# Patient Record
Sex: Female | Born: 1940 | Race: White | Hispanic: No | Smoking: Never smoker
Health system: Southern US, Community
[De-identification: ages and names within clinical notes are randomized; demographics above are authoritative.]

## PROBLEM LIST (undated history)

## (undated) DIAGNOSIS — E785 Hyperlipidemia, unspecified: Secondary | ICD-10-CM

## (undated) DIAGNOSIS — R0600 Dyspnea, unspecified: Secondary | ICD-10-CM

## (undated) DIAGNOSIS — I480 Paroxysmal atrial fibrillation: Secondary | ICD-10-CM

## (undated) DIAGNOSIS — H409 Unspecified glaucoma: Secondary | ICD-10-CM

## (undated) DIAGNOSIS — H269 Unspecified cataract: Secondary | ICD-10-CM

## (undated) DIAGNOSIS — M810 Age-related osteoporosis without current pathological fracture: Secondary | ICD-10-CM

## (undated) DIAGNOSIS — C679 Malignant neoplasm of bladder, unspecified: Secondary | ICD-10-CM

## (undated) DIAGNOSIS — F32A Depression, unspecified: Secondary | ICD-10-CM

## (undated) DIAGNOSIS — R0609 Other forms of dyspnea: Secondary | ICD-10-CM

## (undated) HISTORY — DX: Malignant neoplasm of bladder, unspecified: C67.9

## (undated) HISTORY — DX: Other forms of dyspnea: R06.09

## (undated) HISTORY — PX: OTHER SURGICAL HISTORY: SHX169

## (undated) HISTORY — DX: Paroxysmal atrial fibrillation: I48.0

## (undated) HISTORY — DX: Age-related osteoporosis without current pathological fracture: M81.0

## (undated) HISTORY — DX: Dyspnea, unspecified: R06.00

## (undated) HISTORY — DX: Unspecified glaucoma: H40.9

## (undated) HISTORY — DX: Hyperlipidemia, unspecified: E78.5

## (undated) HISTORY — DX: Depression, unspecified: F32.A

## (undated) HISTORY — DX: Unspecified cataract: H26.9

---

## 2014-02-07 ENCOUNTER — Telehealth: Payer: Self-pay | Admitting: Cardiovascular Disease

## 2014-02-07 NOTE — Telephone Encounter (Signed)
Received records from [facility]--(Dr Gilford Rile) for appointment on 11/18 with Dr Gwenlyn Found.  Records given to [facility] (medical records) for Dr Kennon Holter schedule on 02/09/14.  lp

## 2014-02-09 ENCOUNTER — Ambulatory Visit (INDEPENDENT_AMBULATORY_CARE_PROVIDER_SITE_OTHER): Payer: Medicare Other | Admitting: Cardiovascular Disease

## 2014-02-09 ENCOUNTER — Encounter: Payer: Self-pay | Admitting: Cardiovascular Disease

## 2014-02-09 ENCOUNTER — Other Ambulatory Visit: Payer: Self-pay | Admitting: *Deleted

## 2014-02-09 VITALS — BP 116/80 | HR 119 | Ht 59.0 in | Wt 109.0 lb

## 2014-02-09 DIAGNOSIS — Z79899 Other long term (current) drug therapy: Secondary | ICD-10-CM

## 2014-02-09 DIAGNOSIS — E785 Hyperlipidemia, unspecified: Secondary | ICD-10-CM | POA: Insufficient documentation

## 2014-02-09 DIAGNOSIS — I480 Paroxysmal atrial fibrillation: Secondary | ICD-10-CM

## 2014-02-09 DIAGNOSIS — D689 Coagulation defect, unspecified: Secondary | ICD-10-CM

## 2014-02-09 DIAGNOSIS — I4891 Unspecified atrial fibrillation: Secondary | ICD-10-CM

## 2014-02-09 HISTORY — DX: Hyperlipidemia, unspecified: E78.5

## 2014-02-09 MED ORDER — METOPROLOL SUCCINATE ER 50 MG PO TB24: 50.0000 mg | ORAL_TABLET | Freq: Every day | ORAL | Status: DC

## 2014-02-09 NOTE — Patient Instructions (Signed)
Electrical Cardioversion Electrical cardioversion is the delivery of a jolt of electricity to change the rhythm of the heart. Sticky patches or metal paddles are placed on the chest to deliver the electricity from a device. This is done to restore a normal rhythm. A rhythm that is too fast or not regular keeps the heart from pumping well. Electrical cardioversion is done in an emergency if:   There is low or no blood pressure as a result of the heart rhythm.   Normal rhythm must be restored as fast as possible to protect the brain and heart from further damage.   It may save a life. Cardioversion may be done for heart rhythms that are not immediately life threatening, such as atrial fibrillation or flutter, in which:   The heart is beating too fast or is not regular.   Medicine to change the rhythm has not worked.   It is safe to wait in order to allow time for preparation.  Symptoms of the abnormal rhythm are bothersome.  The risk of stroke and other serious problems can be reduced. LET [facility] CARE PROVIDER KNOW ABOUT:   Any allergies you have.  All medicines you are taking, including vitamins, herbs, eye drops, creams, and over-the-counter medicines.  Previous problems you or members of your family have had with the use of anesthetics.   Any blood disorders you have.   Previous surgeries you have had.   Medical conditions you have. RISKS AND COMPLICATIONS  Generally, this is a safe procedure. However, problems can occur and include:   Breathing problems related to the anesthetic used.  A blood clot that breaks free and travels to other parts of your body. This could cause a stroke or other problems. The risk of this is lowered by use of blood-thinning medicine (anticoagulant) prior to the procedure.  Cardiac arrest (rare). BEFORE THE PROCEDURE   You may have tests to detect blood clots in your heart and to evaluate heart function.  You may start  taking anticoagulants so your blood does not clot as easily.   Medicines may be given to help stabilize your heart rate and rhythm. PROCEDURE  You will be given medicine through an IV tube to reduce discomfort and make you sleepy (sedative).   An electrical shock will be delivered. AFTER THE PROCEDURE Your heart rhythm will be watched to make sure it does not change.  Document Released: 03/01/2002 Document Revised: 07/26/2013 Document Reviewed: 09/23/2012 [facility] Patient Information 2015 [place], [place]. This information is not intended to replace advice given to you by your health care provider. Make sure you discuss any questions you have with your health care provider.   Dr Gwenlyn Found has ordered: 1. Increase Metoprolol succinate to 50mg  daily  2.  Echocardiogram. Echocardiography is a painless test that uses sound waves to create images of your heart. It provides your doctor with information about the size and shape of your heart and how well your heart's chambers and valves are working. This procedure takes approximately one hour. There are no restrictions for this procedure.   3. Your physician has recommended that you have a Cardioversion (DCCV) in one month. Electrical Cardioversion uses a jolt of electricity to your heart either through paddles or wired patches attached to your chest. This is a controlled, usually prescheduled, procedure. Defibrillation is done under light anesthesia in the hospital, and you usually go home the day of the procedure. This is done to get your heart  back into a normal rhythm. You are not awake for the procedure. Please see the instruction sheet given to you today.

## 2014-02-09 NOTE — Assessment & Plan Note (Signed)
On statin therapy followed by her PCP 

## 2014-02-09 NOTE — Progress Notes (Signed)
   02/09/2014 Tami Bell   07/10/1940  [id number]  Primary Physician Bell,GREG, MD Primary Cardiologist: Tami Bell J. Tami Moudy MD FACP,FACC,FAHA, FSCAI   HPI:  Tami Bell is a delightful 73-year-old mildly overweight married Caucasian female who states husband Tami Bell is also a patient of mine and who accompanies her today. She is the mother of 3, grandmother and 5 grandchildren. She was referred by Dr. Grisso in [place] for evaluation treatment of new onset symptomatic atrial fibrillation. Her only cardiovascular risk factor is treated hyperlipidemia. She does have an older brother who died of a myocardial infarction and had bypass surgery. She has never had a heart attack or stroke. She denies chest pain or shortness of breath. There is no history of bleeding problems. She noticed increased heart rate and fatigue 2-3 weeks ago and saw her primary care physician who documented new onset A. Fib with RVR. He began low-dose beta blocker and oral anticoagulation resulting in improvement in her symptoms. Routine lab work was performed and apparently her thyroid function tests were normal.   Current Outpatient Prescriptions  Medication Sig Dispense Refill  . bimatoprost (LUMIGAN) 0.03 % ophthalmic solution Place 1 drop into both eyes at bedtime.    . citalopram (CELEXA) 10 MG tablet Take 10 mg by mouth daily.    . dorzolamide (TRUSOPT) 2 % ophthalmic solution 1 drop 2 (two) times daily.    . metoprolol succinate (TOPROL-XL) 25 MG 24 hr tablet Take 25 mg by mouth daily.    . Polyethyl Glycol-Propyl Glycol (SYSTANE OP) Apply 1 drop to eye 2 (two) times daily.    . rivaroxaban (XARELTO) 20 MG TABS tablet Take 20 mg by mouth daily with supper.    . simvastatin (ZOCOR) 80 MG tablet Take 80 mg by mouth daily.    . timolol (TIMOPTIC) 0.25 % ophthalmic solution Place 1 drop into both eyes daily.     No current facility-administered medications for this visit.    No Known  Allergies  History   Social History  . Marital Status: Married    Spouse Name: N/A    Number of Children: N/A  . Years of Education: N/A   Occupational History  . Not on file.   Social History Main Topics  . Smoking status: Never Smoker   . Smokeless tobacco: Not on file  . Alcohol Use: Not on file  . Drug Use: Not on file  . Sexual Activity: Not on file   Other Topics Concern  . Not on file   Social History Narrative  . No narrative on file     Review of Systems: General: negative for chills, fever, night sweats or weight changes.  Cardiovascular: negative for chest pain, dyspnea on exertion, edema, orthopnea, palpitations, paroxysmal nocturnal dyspnea or shortness of breath Dermatological: negative for rash Respiratory: negative for cough or wheezing Urologic: negative for hematuria Abdominal: negative for nausea, vomiting, diarrhea, bright red blood per rectum, melena, or hematemesis Neurologic: negative for visual changes, syncope, or dizziness All other systems reviewed and are otherwise negative except as noted above.    Blood pressure 116/80, pulse 119, height 4' 11" (1.499 m), weight 109 lb (49.442 kg).  General appearance: alert and no distress Neck: no adenopathy, no carotid bruit, no JVD, supple, symmetrical, trachea midline and thyroid not enlarged, symmetric, no tenderness/mass/nodules Lungs: clear to auscultation bilaterally Heart: irregularly irregular rhythm Extremities: extremities normal, atraumatic, no cyanosis or edema and 2+ pedal pulses bilaterally  EKG atrial fibrillation    with rapid ventricular response . I personally reviewed this EKG  ASSESSMENT AND PLAN:   Paroxysmal atrial fibrillation The patient noticed increased heart rate of less than 2-3 weeks. She saw her primary care physician, Dr. Greg Bell , who began her on a low-dose beta blocker and Xarelto oral anticoagulation for atrial fibrillation with rapid ventricular response. She has  felt better since starting the beta blocker. She denies chest pain or shortness of breath but has noticed increased fatigue. I'm going to increase her beta blocker to 50 mg of metoprolol succinate daily, continue her oral anticoagulant and appeared 2-D echocardiogram. I will arrange for her to undergo outpatient DC cardioversion in approximately 4 weeks.  Hyperlipidemia On statin therapy followed by her PCP      Tami Bell J. Tami Rosko MD FACP,FACC,FAHA, FSCAI 02/09/2014 1:21 PM  

## 2014-02-09 NOTE — Assessment & Plan Note (Signed)
The patient noticed increased heart rate of less than 2-3 weeks. She saw her primary care physician, Dr. Gilford Rile , who began her on a low-dose beta blocker and Xarelto oral anticoagulation for atrial fibrillation with rapid ventricular response. She has felt better since starting the beta blocker. She denies chest pain or shortness of breath but has noticed increased fatigue. I'm going to increase her beta blocker to 50 mg of metoprolol succinate daily, continue her oral anticoagulant and appeared 2-D echocardiogram. I will arrange for her to undergo outpatient DC cardioversion in approximately 4 weeks.

## 2014-02-10 ENCOUNTER — Other Ambulatory Visit: Payer: Self-pay | Admitting: Cardiovascular Disease

## 2014-02-10 MED ORDER — RIVAROXABAN 20 MG PO TABS: 20.0000 mg | ORAL_TABLET | Freq: Every day | ORAL | Status: DC

## 2014-02-10 NOTE — Telephone Encounter (Signed)
Rx was sent to pharmacy electronically. Patient's husband notified.

## 2014-02-10 NOTE — Telephone Encounter (Signed)
Pt's husband called in stating that a prescription for Xarelto needs to be refilled and called in to [facility]. Please call  Thanks

## 2014-02-11 ENCOUNTER — Ambulatory Visit (HOSPITAL_COMMUNITY)
Admission: RE | Admit: 2014-02-11 | Discharge: 2014-02-11 | Disposition: A | Discharge status: Home / Self Care | Payer: Medicare Other | Source: Ambulatory Visit | Attending: Cardiology | Admitting: Cardiology

## 2014-02-11 DIAGNOSIS — I480 Paroxysmal atrial fibrillation: Secondary | ICD-10-CM | POA: Insufficient documentation

## 2014-02-11 DIAGNOSIS — E785 Hyperlipidemia, unspecified: Secondary | ICD-10-CM | POA: Insufficient documentation

## 2014-02-11 DIAGNOSIS — I059 Rheumatic mitral valve disease, unspecified: Secondary | ICD-10-CM

## 2014-02-11 DIAGNOSIS — Z8249 Family history of ischemic heart disease and other diseases of the circulatory system: Secondary | ICD-10-CM | POA: Diagnosis not present

## 2014-02-11 NOTE — Progress Notes (Signed)
2D Echocardiogram Complete.  02/11/2014   Tami Bell, [place]

## 2014-03-07 ENCOUNTER — Telehealth: Payer: Self-pay | Admitting: Cardiovascular Disease

## 2014-03-07 NOTE — Telephone Encounter (Signed)
Husband is calling in to see if pt's labs today are fasting labs.

## 2014-03-07 NOTE — Telephone Encounter (Signed)
Spoke with husband [name]. He needed to know if Tahisha's labs needed to be fasting. Informed husband that they did not need to be. Also clarified that cardioversion is scheduled for 2pm on 12/18

## 2014-03-08 LAB — CBC
HCT: 43.9 % (ref 36.0–46.0)
HEMOGLOBIN: 14.7 g/dL (ref 12.0–15.0)
MCH: 33.2 pg (ref 26.0–34.0)
MCHC: 33.5 g/dL (ref 30.0–36.0)
MCV: 99.1 fL (ref 78.0–100.0)
MPV: 11 fL (ref 9.4–12.4)
Platelets: 209 10*3/uL (ref 150–400)
RBC: 4.43 MIL/uL (ref 3.87–5.11)
RDW: 13.5 % (ref 11.5–15.5)
WBC: 4.7 10*3/uL (ref 4.0–10.5)

## 2014-03-08 LAB — APTT: aPTT: 33 seconds (ref 24–37)

## 2014-03-08 LAB — BASIC METABOLIC PANEL
BUN: 15 mg/dL (ref 6–23)
CHLORIDE: 105 meq/L (ref 96–112)
CO2: 31 meq/L (ref 19–32)
Calcium: 9.9 mg/dL (ref 8.4–10.5)
Creat: 0.79 mg/dL (ref 0.50–1.10)
Glucose, Bld: 67 mg/dL — ABNORMAL LOW (ref 70–99)
POTASSIUM: 4.4 meq/L (ref 3.5–5.3)
SODIUM: 144 meq/L (ref 135–145)

## 2014-03-08 LAB — PROTIME-INR
INR: 1.39 (ref <1.50)
Prothrombin Time: 17.1 seconds — ABNORMAL HIGH (ref 11.6–15.2)

## 2014-03-08 LAB — TSH: TSH: 1.647 u[IU]/mL (ref 0.350–4.500)

## 2014-03-10 ENCOUNTER — Encounter: Payer: Self-pay | Admitting: *Deleted

## 2014-03-11 ENCOUNTER — Other Ambulatory Visit: Payer: Self-pay | Admitting: Physician Assistant

## 2014-03-11 ENCOUNTER — Ambulatory Visit (HOSPITAL_COMMUNITY)
Admission: RE | Admit: 2014-03-11 | Discharge: 2014-03-11 | Disposition: A | Discharge status: Home / Self Care | Payer: Medicare Other | Source: Ambulatory Visit | Attending: Cardiovascular Disease | Admitting: Cardiovascular Disease

## 2014-03-11 ENCOUNTER — Ambulatory Visit (HOSPITAL_COMMUNITY): Payer: Medicare Other | Admitting: Anesthesiology

## 2014-03-11 ENCOUNTER — Telehealth: Payer: Self-pay | Admitting: Physician Assistant

## 2014-03-11 ENCOUNTER — Encounter (HOSPITAL_COMMUNITY): Payer: Self-pay | Admitting: *Deleted

## 2014-03-11 ENCOUNTER — Encounter (HOSPITAL_COMMUNITY)
Admission: RE | Disposition: A | Discharge status: Home / Self Care | Payer: Self-pay | Source: Ambulatory Visit | Attending: Cardiovascular Disease

## 2014-03-11 DIAGNOSIS — I480 Paroxysmal atrial fibrillation: Secondary | ICD-10-CM | POA: Insufficient documentation

## 2014-03-11 DIAGNOSIS — E785 Hyperlipidemia, unspecified: Secondary | ICD-10-CM | POA: Insufficient documentation

## 2014-03-11 DIAGNOSIS — Z8249 Family history of ischemic heart disease and other diseases of the circulatory system: Secondary | ICD-10-CM | POA: Insufficient documentation

## 2014-03-11 DIAGNOSIS — I4891 Unspecified atrial fibrillation: Secondary | ICD-10-CM

## 2014-03-11 DIAGNOSIS — I471 Supraventricular tachycardia: Secondary | ICD-10-CM | POA: Diagnosis not present

## 2014-03-11 HISTORY — PX: CARDIOVERSION: SHX1299

## 2014-03-11 SURGERY — CARDIOVERSION
Anesthesia: Monitor Anesthesia Care

## 2014-03-11 MED ORDER — LIDOCAINE HCL (CARDIAC) 20 MG/ML IV SOLN
INTRAVENOUS | Status: DC | PRN
  Administered 2014-03-11: 20 mg via INTRAVENOUS

## 2014-03-11 MED ORDER — RIVAROXABAN 20 MG PO TABS: 20.0000 mg | ORAL_TABLET | Freq: Every day | ORAL | Status: DC

## 2014-03-11 MED ORDER — METOPROLOL SUCCINATE ER 50 MG PO TB24: 100.0000 mg | ORAL_TABLET | Freq: Every day | ORAL | Status: DC

## 2014-03-11 MED ORDER — SODIUM CHLORIDE 0.9 % IV SOLN
INTRAVENOUS | Status: DC
  Administered 2014-03-11: 500 mL via INTRAVENOUS

## 2014-03-11 MED ORDER — PROPOFOL 10 MG/ML IV BOLUS
INTRAVENOUS | Status: DC | PRN
  Administered 2014-03-11: 50 mg via INTRAVENOUS

## 2014-03-11 NOTE — Interval H&P Note (Signed)
History and Physical Interval Note:  03/11/2014 8:39 AM  Tami Bell  has presented today for surgery, with the diagnosis of A FIB   The various methods of treatment have been discussed with the patient and family. After consideration of risks, benefits and other options for treatment, the patient has consented to  Procedure(s): CARDIOVERSION (N/A) as a surgical intervention .  The patient's history has been reviewed, patient examined, no change in status, stable for surgery.  I have reviewed the patient's chart and labs.  Questions were answered to the patient's satisfaction.     [name]

## 2014-03-11 NOTE — Anesthesia Postprocedure Evaluation (Signed)
  Anesthesia Post-op Note  Patient: Tami Bell  Procedure(s) Performed: Procedure(s): CARDIOVERSION (N/A)  Patient Location: PACU  Anesthesia Type:MAC  Level of Consciousness: awake and alert   Airway and Oxygen Therapy: Patient Spontanous Breathing and Patient connected to nasal cannula oxygen  Post-op Pain: none  Post-op Assessment: Post-op Vital signs reviewed  Post-op Vital Signs: Reviewed and stable  Last Vitals:  Filed Vitals:   03/11/14 1246  BP: 109/83  Resp: 14    Complications: No apparent anesthesia complications

## 2014-03-11 NOTE — H&P (View-Only) (Signed)
02/09/2014 Tami Bell   15-Jan-1941  [id number]  Primary Physician Gilford Rile, MD Primary Cardiologist: [name] [name]   HPI:  Tami Bell is a delightful 73 year old mildly overweight married Caucasian female who states husband Tami Bell is also a patient of mine and who accompanies her today. She is the mother of 46, grandmother and 5 grandchildren. She was referred by [name] in [facility] for evaluation treatment of new onset symptomatic atrial fibrillation. Her only cardiovascular risk factor is treated hyperlipidemia. She does have an older brother who died of a myocardial infarction and had bypass surgery. She has never had a heart attack or stroke. She denies chest pain or shortness of breath. There is no history of bleeding problems. She noticed increased heart rate and fatigue 2-3 weeks ago and saw her primary care physician who documented new onset A. Fib with RVR. He began low-dose beta blocker and oral anticoagulation resulting in improvement in her symptoms. Routine lab work was performed and apparently her thyroid function tests were normal.   Current Outpatient Prescriptions  Medication Sig Dispense Refill  . bimatoprost (LUMIGAN) 0.03 % ophthalmic solution Place 1 drop into both eyes at bedtime.    . citalopram (CELEXA) 10 MG tablet Take 10 mg by mouth daily.    . dorzolamide (TRUSOPT) 2 % ophthalmic solution 1 drop 2 (two) times daily.    . metoprolol succinate (TOPROL-XL) 25 MG 24 hr tablet Take 25 mg by mouth daily.    [name] (SYSTANE OP) Apply 1 drop to eye 2 (two) times daily.    . rivaroxaban (XARELTO) 20 MG TABS tablet Take 20 mg by mouth daily with supper.    . simvastatin (ZOCOR) 80 MG tablet Take 80 mg by mouth daily.    . timolol (TIMOPTIC) 0.25 % ophthalmic solution Place 1 drop into both eyes daily.     No current facility-administered medications for this visit.    No Known  Allergies  History   Social History  . Marital Status: Married    Spouse Name: N/A    Number of Children: N/A  . Years of Education: N/A   Occupational History  . Not on file.   Social History Main Topics  . Smoking status: Never Smoker   . Smokeless tobacco: Not on file  . Alcohol Use: Not on file  . Drug Use: Not on file  . Sexual Activity: Not on file   Other Topics Concern  . Not on file   Social History Narrative  . No narrative on file     Review of Systems: General: negative for chills, fever, night sweats or weight changes.  Cardiovascular: negative for chest pain, dyspnea on exertion, edema, orthopnea, palpitations, paroxysmal nocturnal dyspnea or shortness of breath Dermatological: negative for rash Respiratory: negative for cough or wheezing Urologic: negative for hematuria Abdominal: negative for nausea, vomiting, diarrhea, bright red blood per rectum, melena, or hematemesis Neurologic: negative for visual changes, syncope, or dizziness All other systems reviewed and are otherwise negative except as noted above.    Blood pressure 116/80, pulse 119, height 4\' 11"  (1.499 m), weight 109 lb (49.442 kg).  General appearance: alert and no distress Neck: no adenopathy, no carotid bruit, no JVD, supple, symmetrical, trachea midline and thyroid not enlarged, symmetric, no tenderness/mass/nodules Lungs: clear to auscultation bilaterally Heart: irregularly irregular rhythm Extremities: extremities normal, atraumatic, no cyanosis or edema and 2+ pedal pulses bilaterally  EKG atrial fibrillation  with rapid ventricular response . I personally reviewed this EKG  ASSESSMENT AND PLAN:   Paroxysmal atrial fibrillation The patient noticed increased heart rate of less than 2-3 weeks. She saw her primary care physician, Dr. Gilford Rile , who began her on a low-dose beta blocker and Xarelto oral anticoagulation for atrial fibrillation with rapid ventricular response. She has  felt better since starting the beta blocker. She denies chest pain or shortness of breath but has noticed increased fatigue. I'm going to increase her beta blocker to 50 mg of metoprolol succinate daily, continue her oral anticoagulant and appeared 2-D echocardiogram. I will arrange for her to undergo outpatient DC cardioversion in approximately 4 weeks.  Hyperlipidemia On statin therapy followed by her PCP      [name] [facility] 02/09/2014 1:21 PM

## 2014-03-11 NOTE — Transfer of Care (Signed)
Immediate Anesthesia Transfer of Care Note  Patient: Tami Bell  Procedure(s) Performed: Procedure(s): CARDIOVERSION (N/A)  Patient Location: Endoscopy Unit  Anesthesia Type:MAC  Level of Consciousness: awake  Airway & Oxygen Therapy: Patient Spontanous Breathing and Patient connected to nasal cannula oxygen  Post-op Assessment: Report given to PACU RN and Post -op Vital signs reviewed and stable  Post vital signs: Reviewed and stable  Complications: No apparent anesthesia complications

## 2014-03-11 NOTE — Discharge Instructions (Addendum)

## 2014-03-11 NOTE — Telephone Encounter (Signed)
     Patient paged after hours provider wanting clarification on what medications to keep taking. She had a DCCV today and wanted to know if she should keep taking the xarelto, she only had 2 left. I told her to definitely keep taking this medication and refilled it for her at her pharmacy. She voiced understanding.   Angelena Form PA-C  [facility]

## 2014-03-11 NOTE — Anesthesia Preprocedure Evaluation (Addendum)
Anesthesia Evaluation  Patient identified by MRN, date of birth, ID band Patient awake    Reviewed: Allergy & Precautions, H&P , NPO status , Patient's Chart, lab work & pertinent test results, reviewed documented beta blocker date and time   Airway Mallampati: II   Neck ROM: Full    Dental  (+) Teeth Intact, Edentulous Upper   Pulmonary neg pulmonary ROS,  breath sounds clear to auscultation        Cardiovascular + dysrhythmias Atrial Fibrillation Rhythm:Irregular  EF 55%   Neuro/Psych    GI/Hepatic negative GI ROS, Neg liver ROS,   Endo/Other  negative endocrine ROS  Renal/GU negative Renal ROS     Musculoskeletal   Abdominal (+)  Abdomen: soft.    Peds  Hematology   Anesthesia Other Findings   Reproductive/Obstetrics                            Anesthesia Physical Anesthesia Plan  ASA: III  Anesthesia Plan: MAC   Post-op Pain Management:    Induction:   Airway Management Planned: Nasal Cannula  Additional Equipment:   Intra-op Plan:   Post-operative Plan:   Informed Consent: I have reviewed the patients History and Physical, chart, labs and discussed the procedure including the risks, benefits and alternatives for the proposed anesthesia with the patient or authorized representative who has indicated his/her understanding and acceptance.   Dental advisory given  Plan Discussed with: CRNA, Anesthesiologist and Surgeon  Anesthesia Plan Comments:        Anesthesia Quick Evaluation

## 2014-03-11 NOTE — Interval H&P Note (Signed)
History and Physical Interval Note:  03/11/2014 1:14 PM  Tami Bell  has presented today for surgery, with the diagnosis of A FIB   The various methods of treatment have been discussed with the patient and family. After consideration of risks, benefits and other options for treatment, the patient has consented to  Procedure(s): CARDIOVERSION (N/A) as a surgical intervention .  The patient's history has been reviewed, patient examined, no change in status, stable for surgery.  I have reviewed the patient's chart and labs.  Questions were answered to the patient's satisfaction.     Hy Swiatek

## 2014-03-11 NOTE — Op Note (Signed)
After IV propofol 50 mg IV was administered for sedation for cardioversion, the rhythm converted to ectopic atrial tachycardia and subsequently NSR75 bpm with PACs No cardioversion shock was necessary. Increase beta blocker and DC once sedation resolves.  Sanda Klein, MD, [facility] [phone] office [phone] pager

## 2014-03-14 ENCOUNTER — Telehealth: Payer: Self-pay | Admitting: Cardiovascular Disease

## 2014-03-14 ENCOUNTER — Encounter (HOSPITAL_COMMUNITY): Payer: Self-pay | Admitting: Cardiovascular Disease

## 2014-03-14 MED ORDER — METOPROLOL SUCCINATE ER 50 MG PO TB24: 100.0000 mg | ORAL_TABLET | Freq: Every day | ORAL | Status: DC

## 2014-03-14 NOTE — Telephone Encounter (Signed)
Refill sent for 90 day supply. Pt voiced understanding.

## 2014-03-14 NOTE — Telephone Encounter (Signed)
Mr.Tami Bell is calling because [name] is taking Metoprolol (2pils twice a day ) and she has only 30 pills and wants to know will she be able to get more refills after she has completed the first 30 day prescription or can it be for 90 days . Please call   Thanks

## 2014-03-21 ENCOUNTER — Telehealth: Payer: Self-pay | Admitting: Cardiovascular Disease

## 2014-03-21 NOTE — Telephone Encounter (Signed)
I have talked to this pt. And she states she has been feeling weak and tired and thinks its the new medication she is on ;she also wants something for her nerves,pt. States she has an appt to see Dr. Gwenlyn Found in February . Pt instructed to make appt. With PCP for nerve pill . Pt. Agreed with plan

## 2014-03-21 NOTE — Telephone Encounter (Signed)
Patient is weak and nervous.  Wants to talk with someone about her medications.

## 2014-04-05 ENCOUNTER — Telehealth: Payer: Self-pay | Admitting: Cardiovascular Disease

## 2014-04-05 NOTE — Telephone Encounter (Signed)
Returned call to patient. She reports persistent fatigue and lack of energy since being on metoprolol. Her PCP decreased her metoprolol succinate dose from 100mg  to 50mg  about 2 weeks ago, after she was advised to consult PCP for her "nerves". She states she felt a little better after the dose was decreased initially and was not as "uptight" but now has no energy again. She states she used to could walk/run a mile and play basketball but now she gives out if she walks 1/4 mile or washes the dishes. Inquired about patient's VS - she does not have a home BP monitor and checks BP at pharmacy. She states her BP and HR were "normal" but I told her normal is different for different people. She thinks her BP is around 120/84 and HR in the 60s. She has an OV with Dr. Gwenlyn Found Feb 5 but would like advice about what to do r/t her medications/side effects.   Message routed to Dr. Gwenlyn Found & [place], RN for advice and follow up

## 2014-04-05 NOTE — Telephone Encounter (Signed)
Please call,pt have no energy.Since her Metoprolol been decreased,still no energy

## 2014-04-06 NOTE — Telephone Encounter (Signed)
Wait until OV to discuss

## 2014-04-07 NOTE — Telephone Encounter (Signed)
I spoke with patient.  She complains of weakness and fatigue that has been occuring x 1 month.  She attributes it to the lopressor.  Since the decrease in lopressor dose, she has seen very little improvement.  She also complains of some chest tightness that has been occuring x1 month.  The tightness resolves with rest.  She denies palpitations.  I offered an appt today with another MD, she denied and wants to see Dr Gwenlyn Found.  I made her appt with Dr Gwenlyn Found 1/15.

## 2014-04-08 ENCOUNTER — Ambulatory Visit (INDEPENDENT_AMBULATORY_CARE_PROVIDER_SITE_OTHER): Payer: Self-pay | Admitting: Cardiovascular Disease

## 2014-04-08 ENCOUNTER — Encounter: Payer: Self-pay | Admitting: Cardiovascular Disease

## 2014-04-08 VITALS — BP 92/76 | HR 64 | Ht 59.0 in | Wt 110.0 lb

## 2014-04-08 DIAGNOSIS — I480 Paroxysmal atrial fibrillation: Secondary | ICD-10-CM

## 2014-04-08 DIAGNOSIS — R079 Chest pain, unspecified: Secondary | ICD-10-CM

## 2014-04-08 DIAGNOSIS — E785 Hyperlipidemia, unspecified: Secondary | ICD-10-CM

## 2014-04-08 NOTE — Progress Notes (Signed)
04/08/2014 Sofie Hartigan Findling   12-20-1940  [id number]  Primary Physician Gilford Rile, MD Primary Cardiologist: [name] [name]   HPI:  Mrs. Payes is a delightful 74 year old mildly overweight married Caucasian female who states husband [name] is also a patient of mine and who accompanies her today. I last saw her 02/09/14. She is the mother of 26, grandmother and 5 grandchildren. She was referred by [name] in [facility] for evaluation treatment of new onset symptomatic atrial fibrillation. Her only cardiovascular risk factor is treated hyperlipidemia. She does have an older brother who died of a myocardial infarction and had bypass surgery. She has never had a heart attack or stroke. She denies chest pain or shortness of breath. There is no history of bleeding problems. She noticed increased heart rate and fatigue 2-3 weeks ago and saw her primary care physician who documented new onset A. Fib with RVR. He began low-dose beta blocker and oral anticoagulation resulting in improvement in her symptoms. Routine lab work was performed and apparently her thyroid function tests were normal.she was hospitalized in December with A. Fib with RVR and spontaneously converted to sinus rhythm. She feels weak and has had increasing dyspnea on exertion.   Current Outpatient Prescriptions  Medication Sig Dispense Refill  . bimatoprost (LUMIGAN) 0.03 % ophthalmic solution Place 1 drop into both eyes at bedtime.    . citalopram (CELEXA) 10 MG tablet Take 10 mg by mouth daily.    . dorzolamide (TRUSOPT) 2 % ophthalmic solution Place 1 drop into both eyes 2 (two) times daily.     . metoprolol succinate (TOPROL-XL) 50 MG 24 hr tablet Take 2 tablets (100 mg total) by mouth daily. 180 tablet 3  . Polyethyl Glycol-Propyl Glycol (SYSTANE OP) Apply 1 drop to eye 2 (two) times daily.    . rivaroxaban (XARELTO) 20 MG TABS tablet Take 1 tablet (20 mg total) by mouth daily with supper. 30  tablet 11  . simvastatin (ZOCOR) 80 MG tablet Take 80 mg by mouth daily.    . timolol (TIMOPTIC) 0.25 % ophthalmic solution Place 1 drop into both eyes daily.     No current facility-administered medications for this visit.    No Known Allergies  History   Social History  . Marital Status: Married    Spouse Name: N/A    Number of Children: N/A  . Years of Education: N/A   Occupational History  . Not on file.   Social History Main Topics  . Smoking status: Never Smoker   . Smokeless tobacco: Not on file  . Alcohol Use: No  . Drug Use: No  . Sexual Activity: Not on file   Other Topics Concern  . Not on file   Social History Narrative     Review of Systems: General: negative for chills, fever, night sweats or weight changes.  Cardiovascular: negative for chest pain, dyspnea on exertion, edema, orthopnea, palpitations, paroxysmal nocturnal dyspnea or shortness of breath Dermatological: negative for rash Respiratory: negative for cough or wheezing Urologic: negative for hematuria Abdominal: negative for nausea, vomiting, diarrhea, bright red blood per rectum, melena, or hematemesis Neurologic: negative for visual changes, syncope, or dizziness All other systems reviewed and are otherwise negative except as noted above.    Blood pressure 92/76, pulse 64, height 4\' 11"  (1.499 m), weight 110 lb (49.896 kg).  General appearance: alert and no distress Neck: no adenopathy, no carotid bruit, no JVD, supple, symmetrical, trachea midline and  thyroid not enlarged, symmetric, no tenderness/mass/nodules Lungs: clear to auscultation bilaterally Heart: regular rate and rhythm, S1, S2 normal, no murmur, click, rub or gallop Extremities: extremities normal, atraumatic, no cyanosis or edema  EKG normal sinus rhythm at 64 with occasional PACs. I personally reviewed this EKG  ASSESSMENT AND PLAN:   Paroxysmal atrial fibrillation History of PAF first noticed in November of last year  admitted to Hospital in December and Spontaneously Converting before She Could Be DC Cardioverted. She Is on Xarelto and a Beta Blocker. She Complains of Increasing Dyspnea on Exertion. I Am Going to Get a Pharmacologic Myoview Stress Test to Rule Out an Ischemic Etiology.   Hyperlipidemia History of hyperlipidemia on simvastatin 80 mg a day followed by her PCP       [name] [facility], [facility] 04/08/2014 1:06 PM

## 2014-04-08 NOTE — Assessment & Plan Note (Signed)
History of hyperlipidemia on simvastatin 80 mg a day followed by her PCP

## 2014-04-08 NOTE — Patient Instructions (Signed)
  We will see you back in follow up after test.  Dr Gwenlyn Found has ordered: Leane Call- this is a test that looks at the blood flow to your heart muscle.  It takes approximately 2 1/2 hours. Please follow instruction sheet, as given.

## 2014-04-08 NOTE — Assessment & Plan Note (Signed)
History of PAF first noticed in November of last year admitted to Hospital in December and Spontaneously Converting before She Could Be DC Cardioverted. She Is on Xarelto and a Beta Blocker. She Complains of Increasing Dyspnea on Exertion. I Am Going to Get a Pharmacologic Myoview Stress Test to Rule Out an Ischemic Etiology.

## 2014-04-12 ENCOUNTER — Telehealth (HOSPITAL_COMMUNITY): Payer: Self-pay

## 2014-04-12 NOTE — Telephone Encounter (Signed)
Encounter completed.

## 2014-04-14 ENCOUNTER — Ambulatory Visit (HOSPITAL_COMMUNITY)
Admission: RE | Admit: 2014-04-14 | Discharge: 2014-04-14 | Disposition: A | Discharge status: Home / Self Care | Payer: Medicare PPO | Source: Ambulatory Visit | Attending: Cardiology | Admitting: Cardiology

## 2014-04-14 DIAGNOSIS — R5383 Other fatigue: Secondary | ICD-10-CM | POA: Insufficient documentation

## 2014-04-14 DIAGNOSIS — R42 Dizziness and giddiness: Secondary | ICD-10-CM | POA: Insufficient documentation

## 2014-04-14 DIAGNOSIS — R9431 Abnormal electrocardiogram [ECG] [EKG]: Secondary | ICD-10-CM | POA: Diagnosis not present

## 2014-04-14 DIAGNOSIS — R002 Palpitations: Secondary | ICD-10-CM | POA: Diagnosis not present

## 2014-04-14 DIAGNOSIS — Z8249 Family history of ischemic heart disease and other diseases of the circulatory system: Secondary | ICD-10-CM | POA: Diagnosis not present

## 2014-04-14 DIAGNOSIS — R0609 Other forms of dyspnea: Secondary | ICD-10-CM | POA: Diagnosis not present

## 2014-04-14 DIAGNOSIS — R079 Chest pain, unspecified: Secondary | ICD-10-CM

## 2014-04-14 DIAGNOSIS — E785 Hyperlipidemia, unspecified: Secondary | ICD-10-CM | POA: Insufficient documentation

## 2014-04-14 MED ORDER — AMINOPHYLLINE 25 MG/ML IV SOLN
75.0000 mg | Freq: Once | INTRAVENOUS | Status: AC
  Administered 2014-04-14: 75 mg via INTRAVENOUS

## 2014-04-14 MED ORDER — REGADENOSON 0.4 MG/5ML IV SOLN
0.4000 mg | Freq: Once | INTRAVENOUS | Status: AC
  Administered 2014-04-14: 0.4 mg via INTRAVENOUS

## 2014-04-14 MED ORDER — TECHNETIUM TC 99M SESTAMIBI GENERIC - CARDIOLITE
10.5000 | Freq: Once | INTRAVENOUS | Status: AC | PRN
  Administered 2014-04-14: 11 via INTRAVENOUS

## 2014-04-14 MED ORDER — TECHNETIUM TC 99M SESTAMIBI GENERIC - CARDIOLITE
31.6000 | Freq: Once | INTRAVENOUS | Status: AC | PRN
  Administered 2014-04-14: 32 via INTRAVENOUS

## 2014-04-14 NOTE — Procedures (Addendum)
[facility] [address] [place] [address] [phone]  Cardiology Nuclear Med Study  Tami Bell is a 74 y.o. female     MRN : [id number]     DOB: [birth year]  Procedure Date: 04/14/2014  Nuclear Med Background Indication for Stress Test:  Evaluation for Ischemia and Abnormal EKG History:  AFIB;No prior respiratpry history reported;No prior NUC MPI for comparison; Cardiac Risk Factors: Family History - CAD and Lipids  Symptoms:  Dizziness, DOE, Fatigue, Light-Headedness and Palpitations   Nuclear Pre-Procedure Caffeine/Decaff Intake:  1:00am NPO After: 11am   IV Site: R Forearm  IV 0.9% NS with Angio Cath:  22g  Chest Size (in):  n/a IV Started by: Rolene Course, RN  Height: 4\' 11"  (1.499 m)  Cup Size: A  BMI:  Body mass index is 22.21 kg/(m^2). Weight:  110 lb (49.896 kg)   Tech Comments:  n/a    Nuclear Med Study 1 or 2 day study: 1 day  Stress Test Type:  [place] Provider:  Quay Burow, MD   Resting Radionuclide: Technetium 15m Sestamibi  Resting Radionuclide Dose: 10.5 mCi   Stress Radionuclide:  Technetium 42m Sestamibi  Stress Radionuclide Dose: 31.6 mCi           Stress Protocol Rest HR: 103 Stress HR: 122  Rest BP:135/98 Stress BP: 136/99  Exercise Time (min): n/a METS: n/a          Dose of Adenosine (mg):  n/a Dose of Lexiscan: 0.4 mg  Dose of Atropine (mg): n/a Dose of Dobutamine: n/a mcg/kg/min (at max HR)  Stress Test Technologist: Mellody Memos, CCT Nuclear Technologist: Imagene Riches, CNMT   Rest Procedure:  Myocardial perfusion imaging was performed at rest 45 minutes following the intravenous administration of Technetium 69m Sestamibi. Stress Procedure:  The patient received IV Lexiscan 0.4 mg over 15-seconds.  Technetium 69m Sestamibi injected IV at 30-seconds.  Patient experienced shortness of breath, stomach pains and was administered 75 mg of Aminophylline IV.  There were no significant changes with Lexiscan.  Quantitative spect images were obtained after a 45 minute delay.  Transient Ischemic Dilatation (Normal <1.22):  1.09  QGS EDV:  46 ml QGS ESV:  27 ml LV Ejection Fraction: 41%    Rest ECG: NSR with non-specific ST-T wave changes and Sinus with runs of ectopic atrial tachycardia vs slow atrial flutter.  Stress ECG: No significant ST segment change suggestive of ischemia.  QPS Raw Data Images:  Acquisition technically good; normal left ventricular size. Stress Images:  Normal homogeneous uptake in all areas of the myocardium. Rest Images:  Normal homogeneous uptake in all areas of the myocardium. Subtraction (SDS):  No evidence of ischemia.  Impression Exercise Capacity:  Lexiscan with no exercise. BP Response:  Normal blood pressure response. Clinical Symptoms:  There is dyspnea. ECG Impression:  No significant ST segment change suggestive of ischemia. Comparison with Prior Nuclear Study: No previous nuclear study performed  Overall Impression:  Intermediate risk stress nuclear study with no ischemia or infarction; study classified as intermediate risk due to reduced LV function. Note patient was having runs of ectopic atrial tachycardia vs slow atrial flutter during the study.  LV Wall Motion:  Global hypokinesis; suggest echocardiogram to further assess LV function.   Kirk Ruths, MD  04/14/2014 4:28 PM

## 2014-04-19 ENCOUNTER — Telehealth: Payer: Self-pay | Admitting: *Deleted

## 2014-04-19 DIAGNOSIS — I519 Heart disease, unspecified: Secondary | ICD-10-CM

## 2014-04-19 NOTE — Telephone Encounter (Signed)
Order placed for echo.

## 2014-04-19 NOTE — Telephone Encounter (Signed)
-----   Message from Lorretta Harp, MD sent at 04/17/2014 11:32 PM EST ----- Intermediate risk myoview. Needs 2D for LV fxn then ROV

## 2014-04-21 ENCOUNTER — Ambulatory Visit (INDEPENDENT_AMBULATORY_CARE_PROVIDER_SITE_OTHER): Payer: Medicare PPO

## 2014-04-21 ENCOUNTER — Ambulatory Visit (HOSPITAL_COMMUNITY)
Admission: RE | Admit: 2014-04-21 | Discharge: 2014-04-21 | Disposition: A | Discharge status: Home / Self Care | Payer: Medicare PPO | Source: Ambulatory Visit | Attending: Cardiovascular Disease | Admitting: Cardiovascular Disease

## 2014-04-21 VITALS — BP 120/82 | HR 88 | Ht 59.0 in | Wt 111.0 lb

## 2014-04-21 DIAGNOSIS — I480 Paroxysmal atrial fibrillation: Secondary | ICD-10-CM | POA: Insufficient documentation

## 2014-04-21 DIAGNOSIS — I519 Heart disease, unspecified: Secondary | ICD-10-CM

## 2014-04-21 DIAGNOSIS — Z8249 Family history of ischemic heart disease and other diseases of the circulatory system: Secondary | ICD-10-CM | POA: Insufficient documentation

## 2014-04-21 DIAGNOSIS — E785 Hyperlipidemia, unspecified: Secondary | ICD-10-CM | POA: Diagnosis not present

## 2014-04-21 NOTE — Progress Notes (Addendum)
2D Echocardiogram Complete.  04/21/2014   Tami Bell, RDCS   Preliminary Technician Findings:  This was a technically difficult study due to the abnormal EKG.  [name] is having shortness of breath and fatigue due to the Atrial Fibrillation witnessed during this exam.  She also states she has had some dizziness and weakness.  This was reported to Dr. Sallyanne Kuster prior to the patient leaving, She has an appointment scheduled with Dr. Gwenlyn Found next Friday the 5th, which the patient was informed of.

## 2014-04-21 NOTE — Patient Instructions (Signed)
Continue same medications  Keep appointment with Dr. Gwenlyn Found 04/29/14 at 10:15 am.   Appointment scheduled with Roderic Palau NP [place] office Friday 05/06/14 at 1:00 pm   ( [phone] )  [address] 3rd floor

## 2014-04-21 NOTE — Progress Notes (Signed)
1.) Reason for visit: Atrial Fib  2.) Name of MD requesting visit: Dr.Croitoru   3.) H&P: Patient had a echo this morning.Patient wanted to be seen feels bad,no energy,light headed,sob.  4.) ROS related to problem: EKG was done revealed sinus rhythm with paroxysmal atrial tachycardia rate 88 bpm.  5.) Assessment and plan per MD: Dr.Croitoru DOD advised continue same medications.Schedule appointment [facility] Fib [facility].Appointment scheduled with Roderic Palau NP [facility] 05/06/14 at 1:00 pm.Advised keep previous appt with Dr.Berry 04/29/14 at 10:15 am.

## 2014-04-29 ENCOUNTER — Encounter: Payer: Self-pay | Admitting: Cardiovascular Disease

## 2014-04-29 ENCOUNTER — Ambulatory Visit (INDEPENDENT_AMBULATORY_CARE_PROVIDER_SITE_OTHER): Payer: Medicare PPO | Admitting: Cardiovascular Disease

## 2014-04-29 ENCOUNTER — Ambulatory Visit
Admission: RE | Admit: 2014-04-29 | Discharge: 2014-04-29 | Disposition: A | Discharge status: Home / Self Care | Payer: Commercial Managed Care - HMO | Source: Ambulatory Visit | Attending: Cardiovascular Disease | Admitting: Cardiovascular Disease

## 2014-04-29 VITALS — BP 118/72 | HR 59 | Ht 59.0 in | Wt 112.4 lb

## 2014-04-29 DIAGNOSIS — R0600 Dyspnea, unspecified: Secondary | ICD-10-CM

## 2014-04-29 DIAGNOSIS — Z01818 Encounter for other preprocedural examination: Secondary | ICD-10-CM

## 2014-04-29 DIAGNOSIS — I480 Paroxysmal atrial fibrillation: Secondary | ICD-10-CM

## 2014-04-29 DIAGNOSIS — D689 Coagulation defect, unspecified: Secondary | ICD-10-CM

## 2014-04-29 DIAGNOSIS — E785 Hyperlipidemia, unspecified: Secondary | ICD-10-CM

## 2014-04-29 DIAGNOSIS — R0609 Other forms of dyspnea: Secondary | ICD-10-CM

## 2014-04-29 LAB — CBC
HCT: 41.9 % (ref 36.0–46.0)
Hemoglobin: 14.5 g/dL (ref 12.0–15.0)
MCH: 33.6 pg (ref 26.0–34.0)
MCHC: 34.6 g/dL (ref 30.0–36.0)
MCV: 97.2 fL (ref 78.0–100.0)
MPV: 10.5 fL (ref 8.6–12.4)
Platelets: 199 10*3/uL (ref 150–400)
RBC: 4.31 MIL/uL (ref 3.87–5.11)
RDW: 13 % (ref 11.5–15.5)
WBC: 4.9 10*3/uL (ref 4.0–10.5)

## 2014-04-29 LAB — PROTIME-INR
INR: 1.26 (ref <1.50)
Prothrombin Time: 15.8 seconds — ABNORMAL HIGH (ref 11.6–15.2)

## 2014-04-29 LAB — BASIC METABOLIC PANEL
BUN: 14 mg/dL (ref 6–23)
CO2: 28 mEq/L (ref 19–32)
CREATININE: 0.76 mg/dL (ref 0.50–1.10)
Calcium: 9.6 mg/dL (ref 8.4–10.5)
Chloride: 104 mEq/L (ref 96–112)
Glucose, Bld: 92 mg/dL (ref 70–99)
POTASSIUM: 4.4 meq/L (ref 3.5–5.3)
SODIUM: 140 meq/L (ref 135–145)

## 2014-04-29 LAB — APTT: aPTT: 38 seconds — ABNORMAL HIGH (ref 24–37)

## 2014-04-29 NOTE — Assessment & Plan Note (Signed)
History of hyperlipidemia on simvastatin 80 mg a day. This is followed by her PCP

## 2014-04-29 NOTE — Progress Notes (Signed)
04/29/2014 Tami Bell   1941-02-14  [id number]  Primary Physician Tami Rile, MD Primary Cardiologist: [name] [name]   HPI:  Tami Bell is a delightful 74 year old mildly overweight married Caucasian female who states husband Tami Bell is also a patient of mine and who accompanies her today. I last saw her 04/08/14. She is the mother of 17, grandmother and 5 grandchildren. She was referred by Dr. Bea Bell in [facility] for evaluation treatment of new onset symptomatic atrial fibrillation. Her only cardiovascular risk factor is treated hyperlipidemia. She does have an older brother who died of a myocardial infarction and had bypass surgery. She has never had a heart attack or stroke. She denies chest pain but has had fairly recent increase in dyspnea on exertion. There is no history of bleeding problems. She noticed increased heart rate and fatigue 2-3 weeks prior to seeing me late last yearand saw her primary care physician who documented new onset A. Fib with RVR. He began low-dose beta blocker and oral anticoagulation resulting in improvement in her symptoms. Routine lab work was performed and apparently her thyroid function tests were normal.she was hospitalized in December with A. Fib with RVR and spontaneously converted to sinus rhythm. She feels weak and has had increasing dyspnea on exertion. Recent Myoview stress test showed no ischemia but had decreased ejection fraction and 2-D echo revealed an EF of 45-50% with mild global hypokinesia.   Current Outpatient Prescriptions  Medication Sig Dispense Refill  . bimatoprost (LUMIGAN) 0.03 % ophthalmic solution Place 1 drop into both eyes at bedtime.    . citalopram (CELEXA) 10 MG tablet Take 10 mg by mouth daily.    . dorzolamide (TRUSOPT) 2 % ophthalmic solution Place 1 drop into both eyes 2 (two) times daily.     . metoprolol succinate (TOPROL-XL) 50 MG 24 hr tablet Take 2 tablets (100 mg total) by mouth  daily. (Patient taking differently: Take 50 mg by mouth daily. ) 180 tablet 3  . Polyethyl Glycol-Propyl Glycol (SYSTANE OP) Apply 1 drop to eye 2 (two) times daily.    . rivaroxaban (XARELTO) 20 MG TABS tablet Take 1 tablet (20 mg total) by mouth daily with supper. 30 tablet 11  . simvastatin (ZOCOR) 80 MG tablet Take 80 mg by mouth daily.    . timolol (TIMOPTIC) 0.25 % ophthalmic solution Place 1 drop into both eyes daily.     No current facility-administered medications for this visit.    No Known Allergies  History   Social History  . Marital Status: Married    Spouse Name: N/A    Number of Children: N/A  . Years of Education: N/A   Occupational History  . Not on file.   Social History Main Topics  . Smoking status: Never Smoker   . Smokeless tobacco: Not on file  . Alcohol Use: No  . Drug Use: No  . Sexual Activity: Not on file   Other Topics Concern  . Not on file   Social History Narrative     Review of Systems: General: negative for chills, fever, night sweats or weight changes.  Cardiovascular: negative for chest pain, dyspnea on exertion, edema, orthopnea, palpitations, paroxysmal nocturnal dyspnea or shortness of breath Dermatological: negative for rash Respiratory: negative for cough or wheezing Urologic: negative for hematuria Abdominal: negative for nausea, vomiting, diarrhea, bright red blood per rectum, melena, or hematemesis Neurologic: negative for visual changes, syncope, or dizziness All other systems reviewed and  are otherwise negative except as noted above.    Blood pressure 118/72, pulse 59, height 4\' 11"  (1.499 m), weight 112 lb 6.4 oz (50.984 kg).  General appearance: alert and no distress Neck: no adenopathy, no carotid bruit, no JVD, supple, symmetrical, trachea midline and thyroid not enlarged, symmetric, no tenderness/mass/nodules Lungs: clear to auscultation bilaterally Heart: regular rate and rhythm, S1, S2 normal, no murmur, click,  rub or gallop Extremities: extremities normal, atraumatic, no cyanosis or edema  EKG sinus bradycardia 59 without ST or T-wave changes. I personally reviewed this EKG  ASSESSMENT AND PLAN:   Paroxysmal atrial fibrillation History of paroxysmal atrial fibrillation status post conversion back to normal sinus rhythm in December. She had a Myoview stress test that was nonischemic although her ejection fraction was diminished. A 2-D echocardiogram performed recently revealed mild global hypokinesia with EF of 45-50%. She is maintaining sinus rhythm with atrial ectopy on metoprolol and Xarelto  oral anticoagulation   Hyperlipidemia History of hyperlipidemia on simvastatin 80 mg a day. This is followed by her PCP   Dyspnea on exertion She has fairly recent onset dyspnea on exertion for unclear reasons. Her Myoview stress test was nonischemic and she does have mild LV dysfunction and recent PAF maintaining sinus rhythm. I am going to perform outpatient diagnostic coronary arteriography. The right radial approach to define her anatomy and rule out ischemic etiology.       [name] FACP,FACC,FAHA, [facility] 04/29/2014 11:36 AM

## 2014-04-29 NOTE — Assessment & Plan Note (Signed)
She has fairly recent onset dyspnea on exertion for unclear reasons. Her Myoview stress test was nonischemic and she does have mild LV dysfunction and recent PAF maintaining sinus rhythm. I am going to perform outpatient diagnostic coronary arteriography. The right radial approach to define her anatomy and rule out ischemic etiology.

## 2014-04-29 NOTE — Patient Instructions (Signed)
Your physician has requested that you have a cardiac catheterization (Right radial; **Patient on Xarelto-needs to STOP 2 days prior to procedure**). Cardiac catheterization is used to diagnose and/or treat various heart conditions. Doctors may recommend this procedure for a number of different reasons. The most common reason is to evaluate chest pain. Chest pain can be a symptom of coronary artery disease (CAD), and cardiac catheterization can show whether plaque is narrowing or blocking your heart's arteries. This procedure is also used to evaluate the valves, as well as measure the blood flow and oxygen levels in different parts of your heart. For further information please visit [email or web address].   Following your catheterization, you will not be allowed to drive for 3 days.  No lifting, pushing, or pulling greater that 10 pounds is allowed for 1 week.  You will be required to have the following tests prior to the procedure:  1. Blood work-the blood work can be done no more than 7 days prior to the procedure.  It can be done at any [facility] lab.  There is one downstairs on the first floor of this building and one in the [facility] ([address])  2. Chest Xray-the chest xray order has already been placed at the [facility].

## 2014-04-29 NOTE — Assessment & Plan Note (Signed)
History of paroxysmal atrial fibrillation status post conversion back to normal sinus rhythm in December. She had a Myoview stress test that was nonischemic although her ejection fraction was diminished. A 2-D echocardiogram performed recently revealed mild global hypokinesia with EF of 45-50%. She is maintaining sinus rhythm with atrial ectopy on metoprolol and Xarelto  oral anticoagulation

## 2014-05-05 ENCOUNTER — Encounter (HOSPITAL_COMMUNITY)
Admission: RE | Disposition: A | Discharge status: Home / Self Care | Payer: Self-pay | Source: Ambulatory Visit | Attending: Cardiovascular Disease

## 2014-05-05 ENCOUNTER — Ambulatory Visit (HOSPITAL_COMMUNITY)
Admission: RE | Admit: 2014-05-05 | Discharge: 2014-05-05 | Disposition: A | Discharge status: Home / Self Care | Payer: Medicare PPO | Source: Ambulatory Visit | Attending: Cardiovascular Disease | Admitting: Cardiovascular Disease

## 2014-05-05 ENCOUNTER — Encounter (HOSPITAL_COMMUNITY): Payer: Self-pay | Admitting: Cardiovascular Disease

## 2014-05-05 DIAGNOSIS — R0600 Dyspnea, unspecified: Secondary | ICD-10-CM

## 2014-05-05 DIAGNOSIS — I4891 Unspecified atrial fibrillation: Secondary | ICD-10-CM | POA: Insufficient documentation

## 2014-05-05 DIAGNOSIS — E785 Hyperlipidemia, unspecified: Secondary | ICD-10-CM

## 2014-05-05 DIAGNOSIS — D689 Coagulation defect, unspecified: Secondary | ICD-10-CM

## 2014-05-05 DIAGNOSIS — Z01818 Encounter for other preprocedural examination: Secondary | ICD-10-CM

## 2014-05-05 DIAGNOSIS — I480 Paroxysmal atrial fibrillation: Secondary | ICD-10-CM

## 2014-05-05 DIAGNOSIS — R0609 Other forms of dyspnea: Secondary | ICD-10-CM

## 2014-05-05 HISTORY — PX: LEFT HEART CATHETERIZATION WITH CORONARY ANGIOGRAM: SHX5451

## 2014-05-05 SURGERY — LEFT HEART CATHETERIZATION WITH CORONARY ANGIOGRAM
Anesthesia: LOCAL

## 2014-05-05 MED ORDER — ONDANSETRON HCL 4 MG/2ML IJ SOLN: 4.0000 mg | Freq: Four times a day (QID) | INTRAMUSCULAR | Status: DC | PRN

## 2014-05-05 MED ORDER — NITROGLYCERIN 1 MG/10 ML FOR IR/CATH LAB
INTRA_ARTERIAL | Status: AC
  Filled 2014-05-05: qty 10

## 2014-05-05 MED ORDER — HEPARIN SODIUM (PORCINE) 1000 UNIT/ML IJ SOLN
INTRAMUSCULAR | Status: AC
  Filled 2014-05-05: qty 1

## 2014-05-05 MED ORDER — ASPIRIN 81 MG PO CHEW
CHEWABLE_TABLET | ORAL | Status: AC
  Administered 2014-05-05: 81 mg via ORAL
  Filled 2014-05-05: qty 1

## 2014-05-05 MED ORDER — MIDAZOLAM HCL 2 MG/2ML IJ SOLN
INTRAMUSCULAR | Status: AC
  Filled 2014-05-05: qty 2

## 2014-05-05 MED ORDER — LIDOCAINE HCL (PF) 1 % IJ SOLN
INTRAMUSCULAR | Status: AC
  Filled 2014-05-05: qty 30

## 2014-05-05 MED ORDER — VERAPAMIL HCL 2.5 MG/ML IV SOLN
INTRAVENOUS | Status: AC
  Filled 2014-05-05: qty 2

## 2014-05-05 MED ORDER — FENTANYL CITRATE 0.05 MG/ML IJ SOLN
INTRAMUSCULAR | Status: AC
  Filled 2014-05-05: qty 2

## 2014-05-05 MED ORDER — HEPARIN (PORCINE) IN NACL 2-0.9 UNIT/ML-% IJ SOLN
INTRAMUSCULAR | Status: AC
  Filled 2014-05-05: qty 1500

## 2014-05-05 MED ORDER — ACETAMINOPHEN 325 MG PO TABS: 650.0000 mg | ORAL_TABLET | ORAL | Status: DC | PRN

## 2014-05-05 MED ORDER — SODIUM CHLORIDE 0.9 % IJ SOLN: 3.0000 mL | INTRAMUSCULAR | Status: DC | PRN

## 2014-05-05 MED ORDER — MORPHINE SULFATE 2 MG/ML IJ SOLN: 1.0000 mg | INTRAMUSCULAR | Status: DC | PRN

## 2014-05-05 MED ORDER — SODIUM CHLORIDE 0.9 % IV SOLN
INTRAVENOUS | Status: DC
  Administered 2014-05-05: 08:00:00 via INTRAVENOUS

## 2014-05-05 MED ORDER — SODIUM CHLORIDE 0.9 % IV SOLN
INTRAVENOUS | Status: AC
  Administered 2014-05-05: 12:00:00 via INTRAVENOUS

## 2014-05-05 MED ORDER — ASPIRIN 81 MG PO CHEW
81.0000 mg | CHEWABLE_TABLET | ORAL | Status: AC
  Administered 2014-05-05: 81 mg via ORAL

## 2014-05-05 NOTE — Discharge Instructions (Signed)
Radial Site Care °Refer to this sheet in the next few weeks. These instructions provide you with information on caring for yourself after your procedure. Your caregiver may also give you more specific instructions. Your treatment has been planned according to current medical practices, but problems sometimes occur. Call your caregiver if you have any problems or questions after your procedure. °HOME CARE INSTRUCTIONS °· You may shower the day after the procedure. Remove the bandage (dressing) and gently wash the site with plain soap and water. Gently pat the site dry. °· Do not apply powder or lotion to the site. °· Do not submerge the affected site in water for 3 to 5 days. °· Inspect the site at least twice daily. °· Do not flex or bend the affected arm for 24 hours. °· No lifting over 5 pounds (2.3 kg) for 5 days after your procedure. °· Do not drive home if you are discharged the same day of the procedure. Have someone else drive you. °· You may drive 24 hours after the procedure unless otherwise instructed by your caregiver. °· Do not operate machinery or power tools for 24 hours. °· A responsible adult should be with you for the first 24 hours after you arrive home. °What to expect: °· Any bruising will usually fade within 1 to 2 weeks. °· Blood that collects in the tissue (hematoma) may be painful to the touch. It should usually decrease in size and tenderness within 1 to 2 weeks. °SEEK IMMEDIATE MEDICAL CARE IF: °· You have unusual pain at the radial site. °· You have redness, warmth, swelling, or pain at the radial site. °· You have drainage (other than a small amount of blood on the dressing). °· You have chills. °· You have a fever or persistent symptoms for more than 72 hours. °· You have a fever and your symptoms suddenly get worse. °· Your arm becomes pale, cool, tingly, or numb. °· You have heavy bleeding from the site. Hold pressure on the site. °Document Released: 04/13/2010 Document Revised:  06/03/2011 Document Reviewed: 04/13/2010 °ExitCare® Patient Information ©2015 [employer]. This information is not intended to replace advice given to you by your health care provider. Make sure you discuss any questions you have with your health care provider. ° °

## 2014-05-05 NOTE — CV Procedure (Signed)
Tami Bell is a 74 y.o. female    [id number] LOCATION:  FACILITY: [facility]  PHYSICIAN: Quay Burow, M.D. [birth year]   DATE OF PROCEDURE:  05/05/2014  DATE OF DISCHARGE:     CARDIAC CATHETERIZATION     History obtained from chart review.Tami Bell is a delightful 74 year old mildly overweight married Caucasian female who states husband Tami Bell is also a patient of mine and who accompanies her today. I last saw her 04/08/14. She is the mother of 76, grandmother and 5 grandchildren. She was referred by [name] in [facility] for evaluation treatment of new onset symptomatic atrial fibrillation. Her only cardiovascular risk factor is treated hyperlipidemia. She does have an older brother who died of a myocardial infarction and had bypass surgery. She has never had a heart attack or stroke. She denies chest pain but has had fairly recent increase in dyspnea on exertion. There is no history of bleeding problems. She noticed increased heart rate and fatigue 2-3 weeks prior to seeing me late last yearand saw her primary care physician who documented new onset A. Fib with RVR. He began low-dose beta blocker and oral anticoagulation resulting in improvement in her symptoms. Routine lab work was performed and apparently her thyroid function tests were normal.she was hospitalized in December with A. Fib with RVR and spontaneously converted to sinus rhythm. She feels weak and has had increasing dyspnea on exertion. Recent Myoview stress test showed no ischemia but had decreased ejection fraction and 2-D echo revealed an EF of 45-50% with mild global hypokinesia. She presents now for outpatient diagnostic coronary arteriography to rule out an ischemic etiology.   PROCEDURE DESCRIPTION:   The patient was brought to the second floor [facility] [facility]iac cath lab in the postabsorptive state. She was premedicated with Valium 5 mg by mouth, IV Versed and fentanyl. Her right wrist was prepped and shaved  in usual sterile fashion. Xylocaine 1% was used for local anesthesia. A 6 French sheath was inserted into the right radial artery using standard Seldinger technique. The patient received 2500 units  of heparin  intravenously.  A 5 French TIG catheter, FL 3-1/2 cm diagnostic catheter and pigtail catheters were used for selective coronary angiography, left ventriculography respectively. Visipaque dye was used for the entirety of the case (60 mL administered to patient). Retrograde aortic, left ventricular and pressures were recorded.    HEMODYNAMICS:    AO SYSTOLIC/AO DIASTOLIC: 272/53   LV SYSTOLIC/LV DIASTOLIC: 664/40  ANGIOGRAPHIC RESULTS:   1. Left main; normal  2. LAD; normal 3. Left circumflex; nondominant and normal.  4. Right coronary artery; dominant and normal 5. Left ventriculography; RAO left ventriculogram was performed using  25 mL of Visipaque dye at 12 mL/second. The overall LVEF estimated  50 %  Without wall motion abnormalities and with very subtle mild global hypokinesia  IMPRESSION:Tami Bell has normal coronary arteries and essentially normal LV function with normal filling pressures. I do not think that her dyspnea is related to anything cardiovascular. It may be primarily pulmonary which will need to be further evaluated. The sheath was removed and a TR band was placed on the right wrist to achieve patent hemostasis. The patient left the lab in stable condition. She'll be discharged home later today as an outpatient and will see me back in the office along with mid-level provider in several weeks.  [name], [facility] 05/05/2014 10:57 AM

## 2014-05-05 NOTE — H&P (View-Only) (Signed)
04/29/2014 Tami Bell   [birth year]  [id number]  Primary Physician Tami Rile, MD Primary Cardiologist: Tami Harp MD Tami Bell   HPI:  Tami Bell is a delightful 74 year old mildly overweight married Caucasian female who states husband Tami Bell is also a patient of mine and who accompanies her today. I last saw her 04/08/14. She is the mother of 70, grandmother and 5 grandchildren. She was referred by [name] in [facility] for evaluation treatment of new onset symptomatic atrial fibrillation. Her only cardiovascular risk factor is treated hyperlipidemia. She does have an older brother who died of a myocardial infarction and had bypass surgery. She has never had a heart attack or stroke. She denies chest pain but has had fairly recent increase in dyspnea on exertion. There is no history of bleeding problems. She noticed increased heart rate and fatigue 2-3 weeks prior to seeing me late last yearand saw her primary care physician who documented new onset A. Fib with RVR. He began low-dose beta blocker and oral anticoagulation resulting in improvement in her symptoms. Routine lab work was performed and apparently her thyroid function tests were normal.she was hospitalized in December with A. Fib with RVR and spontaneously converted to sinus rhythm. She feels weak and has had increasing dyspnea on exertion. Recent Myoview stress test showed no ischemia but had decreased ejection fraction and 2-D echo revealed an EF of 45-50% with mild global hypokinesia.   Current Outpatient Prescriptions  Medication Sig Dispense Refill  . bimatoprost (LUMIGAN) 0.03 % ophthalmic solution Place 1 drop into both eyes at bedtime.    . citalopram (CELEXA) 10 MG tablet Take 10 mg by mouth daily.    . dorzolamide (TRUSOPT) 2 % ophthalmic solution Place 1 drop into both eyes 2 (two) times daily.     . metoprolol succinate (TOPROL-XL) 50 MG 24 hr tablet Take 2 tablets (100 mg total) by mouth  daily. (Patient taking differently: Take 50 mg by mouth daily. ) 180 tablet 3  . Polyethyl Glycol-Propyl Glycol (SYSTANE OP) Apply 1 drop to eye 2 (two) times daily.    . rivaroxaban (XARELTO) 20 MG TABS tablet Take 1 tablet (20 mg total) by mouth daily with supper. 30 tablet 11  . simvastatin (ZOCOR) 80 MG tablet Take 80 mg by mouth daily.    . timolol (TIMOPTIC) 0.25 % ophthalmic solution Place 1 drop into both eyes daily.     No current facility-administered medications for this visit.    No Known Allergies  History   Social History  . Marital Status: Married    Spouse Name: N/A    Number of Children: N/A  . Years of Education: N/A   Occupational History  . Not on file.   Social History Main Topics  . Smoking status: Never Smoker   . Smokeless tobacco: Not on file  . Alcohol Use: No  . Drug Use: No  . Sexual Activity: Not on file   Other Topics Concern  . Not on file   Social History Narrative     Review of Systems: General: negative for chills, fever, night sweats or weight changes.  Cardiovascular: negative for chest pain, dyspnea on exertion, edema, orthopnea, palpitations, paroxysmal nocturnal dyspnea or shortness of breath Dermatological: negative for rash Respiratory: negative for cough or wheezing Urologic: negative for hematuria Abdominal: negative for nausea, vomiting, diarrhea, bright red blood per rectum, melena, or hematemesis Neurologic: negative for visual changes, syncope, or dizziness All other systems reviewed and  are otherwise negative except as noted above.    Blood pressure 118/72, pulse 59, height 4\' 11"  (1.499 m), weight 112 lb 6.4 oz (50.984 kg).  General appearance: alert and no distress Neck: no adenopathy, no carotid bruit, no JVD, supple, symmetrical, trachea midline and thyroid not enlarged, symmetric, no tenderness/mass/nodules Lungs: clear to auscultation bilaterally Heart: regular rate and rhythm, S1, S2 normal, no murmur, click,  rub or gallop Extremities: extremities normal, atraumatic, no cyanosis or edema  EKG sinus bradycardia 59 without ST or T-wave changes. I personally reviewed this EKG  ASSESSMENT AND PLAN:   Paroxysmal atrial fibrillation History of paroxysmal atrial fibrillation status post conversion back to normal sinus rhythm in December. She had a Myoview stress test that was nonischemic although her ejection fraction was diminished. A 2-D echocardiogram performed recently revealed mild global hypokinesia with EF of 45-50%. She is maintaining sinus rhythm with atrial ectopy on metoprolol and Xarelto  oral anticoagulation   Hyperlipidemia History of hyperlipidemia on simvastatin 80 mg a day. This is followed by her PCP   Dyspnea on exertion She has fairly recent onset dyspnea on exertion for unclear reasons. Her Myoview stress test was nonischemic and she does have mild LV dysfunction and recent PAF maintaining sinus rhythm. I am going to perform outpatient diagnostic coronary arteriography. The right radial approach to define her anatomy and rule out ischemic etiology.       Tami Harp MD FACP,FACC,FAHA, [facility] 04/29/2014 11:36 AM

## 2014-05-05 NOTE — Interval H&P Note (Signed)
Cath Lab Visit (complete for each Cath Lab visit)  Clinical Evaluation Leading to the Procedure:   ACS: No.  Non-ACS:    Anginal Classification: CCS III  Anti-ischemic medical therapy: No Therapy  Non-Invasive Test Results: Low-risk stress test findings: cardiac mortality <1%/year  Prior CABG: No previous CABG      History and Physical Interval Note:  05/05/2014 10:24 AM  Tami Bell  has presented today for surgery, with the diagnosis of sob  The various methods of treatment have been discussed with the patient and family. After consideration of risks, benefits and other options for treatment, the patient has consented to  Procedure(s): LEFT HEART CATHETERIZATION WITH CORONARY ANGIOGRAM (N/A) as a surgical intervention .  The patient's history has been reviewed, patient examined, no change in status, stable for surgery.  I have reviewed the patient's chart and labs.  Questions were answered to the patient's satisfaction.     Lorretta Harp

## 2014-05-06 ENCOUNTER — Ambulatory Visit (HOSPITAL_COMMUNITY): Payer: Medicare PPO | Admitting: Nurse Practitioner

## 2014-05-23 ENCOUNTER — Encounter: Payer: Self-pay | Admitting: Cardiovascular Disease

## 2014-05-23 ENCOUNTER — Ambulatory Visit (INDEPENDENT_AMBULATORY_CARE_PROVIDER_SITE_OTHER): Payer: Medicare PPO | Admitting: Cardiovascular Disease

## 2014-05-23 VITALS — BP 130/68 | HR 60 | Ht 59.0 in | Wt 111.9 lb

## 2014-05-23 DIAGNOSIS — R0609 Other forms of dyspnea: Secondary | ICD-10-CM

## 2014-05-23 DIAGNOSIS — I480 Paroxysmal atrial fibrillation: Secondary | ICD-10-CM

## 2014-05-23 DIAGNOSIS — E785 Hyperlipidemia, unspecified: Secondary | ICD-10-CM

## 2014-05-23 NOTE — Progress Notes (Signed)
05/23/2014 Tami Bell   07-31-1940  [id number]  Primary Physician Gilford Rile, MD Primary Cardiologist: [name] [name]   HPI:  Tami Bell is a delightful 74 year old mildly overweight married Caucasian female who states husband Tami Bell is also a patient of mine and who accompanies her today. I last saw her 04/29/14.  She is the mother of 17, grandmother and 5 grandchildren. She was referred by [name] in [facility] for evaluation treatment of new onset symptomatic atrial fibrillation. Her only cardiovascular risk factor is treated hyperlipidemia. She does have an older brother who died of a myocardial infarction and had bypass surgery. She has never had a heart attack or stroke. She denies chest pain but has had fairly recent increase in dyspnea on exertion. There is no history of bleeding problems. She noticed increased heart rate and fatigue 2-3 weeks prior to seeing me late last yearand saw her primary care physician who documented new onset A. Fib with RVR. He began low-dose beta blocker and oral anticoagulation resulting in improvement in her symptoms. Routine lab work was performed and apparently her thyroid function tests were normal.she was hospitalized in December with A. Fib with RVR and spontaneously converted to sinus rhythm. She was complaining of weakness and increasing dyspnea on exertion. Recent Myoview stress test showed no ischemia but had decreased ejection fraction and 2-D echo revealed an EF of 45-50% with mild global hypokinesia. I performed outpatient cardiac catheterization on her 05/05/14 via the right radial approach revealing normal coronary arteries and low normal LV function. Since that time her dyspnea has markedly improved.   Current Outpatient Prescriptions  Medication Sig Dispense Refill  . ALPRAZolam (XANAX) 0.25 MG tablet Take 0.5 tablets by mouth as needed.    . bimatoprost (LUMIGAN) 0.03 % ophthalmic solution Place 1 drop  into both eyes at bedtime.    . Calcium Carbonate-Vitamin D (CALCIUM + D PO) Take 1 tablet by mouth daily.    . citalopram (CELEXA) 10 MG tablet Take 10 mg by mouth daily.    . dorzolamide (TRUSOPT) 2 % ophthalmic solution Place 1 drop into both eyes 2 (two) times daily.     . metoprolol succinate (TOPROL-XL) 50 MG 24 hr tablet Take 2 tablets (100 mg total) by mouth daily. (Patient taking differently: Take 50 mg by mouth daily. ) 180 tablet 3  . Multiple Vitamins-Minerals (MULTIVITAMIN WITH MINERALS) tablet Take 1 tablet by mouth daily.    [name] (SYSTANE OP) Apply 1 drop to eye 2 (two) times daily.    . rivaroxaban (XARELTO) 20 MG TABS tablet Take 1 tablet (20 mg total) by mouth daily with supper. 30 tablet 11  . simvastatin (ZOCOR) 80 MG tablet Take 80 mg by mouth daily.    . timolol (TIMOPTIC) 0.25 % ophthalmic solution Place 1 drop into both eyes daily.     No current facility-administered medications for this visit.    No Known Allergies  History   Social History  . Marital Status: Married    Spouse Name: N/A  . Number of Children: N/A  . Years of Education: N/A   Occupational History  . Not on file.   Social History Main Topics  . Smoking status: Never Smoker   . Smokeless tobacco: Not on file  . Alcohol Use: No  . Drug Use: No  . Sexual Activity: Not on file   Other Topics Concern  . Not on file   Social  History Narrative     Review of Systems: General: negative for chills, fever, night sweats or weight changes.  Cardiovascular: negative for chest pain, dyspnea on exertion, edema, orthopnea, palpitations, paroxysmal nocturnal dyspnea or shortness of breath Dermatological: negative for rash Respiratory: negative for cough or wheezing Urologic: negative for hematuria Abdominal: negative for nausea, vomiting, diarrhea, bright red blood per rectum, melena, or hematemesis Neurologic: negative for visual changes, syncope, or dizziness All  other systems reviewed and are otherwise negative except as noted above.    Blood pressure 130/68, pulse 60, height 4\' 11"  (1.499 m), weight 111 lb 14.4 oz (50.758 kg).  General appearance: alert and no distress Neck: no adenopathy, no carotid bruit, no JVD, supple, symmetrical, trachea midline and thyroid not enlarged, symmetric, no tenderness/mass/nodules Lungs: clear to auscultation bilaterally Heart: regular rate and rhythm, S1, S2 normal, no murmur, click, rub or gallop Extremities: extremities normal, atraumatic, no cyanosis or edema and right radial artery puncture site is well-healed  EKG not performed today  ASSESSMENT AND PLAN:   Hyperlipidemia History of hyperlipidemia on simvastatin 80 mg a day followed by her PCP.   Dyspnea on exertion History of dyspnea on exertion with recent outpatient cardiac cath revealing normal coronary arteries and low normal LV function with EF of 50%. I do not think her dyspnea was cardiovascular nature. It has improved since I last saw her   Paroxysmal atrial fibrillation History of paroxysmal A. Fib currently on Xarelto oral anticoagulation as well as a beta blocker maintaining sinus rhythm       [name] [facility], [facility] 05/23/2014 3:09 PM

## 2014-05-23 NOTE — Assessment & Plan Note (Signed)
History of hyperlipidemia on simvastatin 80 mg a day followed by her PCP.

## 2014-05-23 NOTE — Assessment & Plan Note (Signed)
History of dyspnea on exertion with recent outpatient cardiac cath revealing normal coronary arteries and low normal LV function with EF of 50%. I do not think her dyspnea was cardiovascular nature. It has improved since I last saw her

## 2014-05-23 NOTE — Assessment & Plan Note (Signed)
History of paroxysmal A. Fib currently on Xarelto oral anticoagulation as well as a beta blocker maintaining sinus rhythm

## 2014-05-23 NOTE — Patient Instructions (Signed)
Dr Gwenlyn Found recommends that you schedule a follow-up appointment in 6 months with an extender.  Dr Gwenlyn Found wants you to follow-up in 1 year. You will receive a reminder letter in the mail two months in advance. If you don't receive a letter, please call our office to schedule the follow-up appointment.

## 2014-12-15 ENCOUNTER — Encounter: Payer: Self-pay | Admitting: Cardiology

## 2014-12-15 ENCOUNTER — Ambulatory Visit (INDEPENDENT_AMBULATORY_CARE_PROVIDER_SITE_OTHER): Payer: Medicare HMO | Admitting: Cardiology

## 2014-12-15 VITALS — BP 124/66 | HR 56 | Ht 59.0 in | Wt 107.1 lb

## 2014-12-15 DIAGNOSIS — E785 Hyperlipidemia, unspecified: Secondary | ICD-10-CM

## 2014-12-15 DIAGNOSIS — I480 Paroxysmal atrial fibrillation: Secondary | ICD-10-CM

## 2014-12-15 DIAGNOSIS — Z7901 Long term (current) use of anticoagulants: Secondary | ICD-10-CM | POA: Diagnosis not present

## 2014-12-15 MED ORDER — METOPROLOL SUCCINATE ER 50 MG PO TB24: 50.0000 mg | ORAL_TABLET | Freq: Every day | ORAL | Status: DC

## 2014-12-15 MED ORDER — RIVAROXABAN 20 MG PO TABS: 20.0000 mg | ORAL_TABLET | Freq: Every day | ORAL | Status: DC

## 2014-12-15 NOTE — Progress Notes (Signed)
Cardiology Office Note   Date:  12/15/2014   ID:  Jennifier, Smitherman [birth year], MRN [id number]  PCP:  Gilford Rile, MD  Cardiologist:  Dr. Gwenlyn Found    Chief Complaint  Patient presents with  . Follow-up    no complaints      History of Present Illness: VONNE MCDANEL is a 74 y.o. female who presents for PAF.  She has a hx of PAF and hyperlipidemia.  + FH of CAD with brother.  Myoview stress test showed no ischemia but had decreased ejection fraction and 2-D echo revealed an EF of 45-50% with mild global hypokinesia. Dr. Adora Fridge  performed outpatient cardiac catheterization on her 05/05/14 via the right radial approach revealing normal coronary arteries and low normal LV function.   Today no complaints.  She has no awareness of any a fib but not sure she knew she was in before.  No chest pain, no SOB.  Pt wants to come off xarelto   Past Medical History  Diagnosis Date  . Paroxysmal atrial fibrillation   . Hyperlipidemia   . Dyspnea on exertion     Past Surgical History  Procedure Laterality Date  . Cardioversion N/A 03/11/2014    Procedure: CARDIOVERSION;  Surgeon: Sanda Klein, MD;  Location: [facility] ENDOSCOPY;  Service: Cardiovascular;  Laterality: N/A;  . Left heart catheterization with coronary angiogram N/A 05/05/2014    Procedure: LEFT HEART CATHETERIZATION WITH CORONARY ANGIOGRAM;  Surgeon: Lorretta Harp, MD;  Location: [facility] CATH LAB;  Service: Cardiovascular;  Laterality: N/A;     Current Outpatient Prescriptions  Medication Sig Dispense Refill  . ALPRAZolam (XANAX) 0.25 MG tablet Take 0.5 tablets by mouth as needed.    . bimatoprost (LUMIGAN) 0.03 % ophthalmic solution Place 1 drop into both eyes at bedtime.    . Calcium Carbonate-Vitamin D (CALCIUM + D PO) Take 1 tablet by mouth daily.    . citalopram (CELEXA) 10 MG tablet Take 10 mg by mouth daily.    . dorzolamide (TRUSOPT) 2 % ophthalmic solution Place 1 drop into both eyes 2 (two) times daily.     . metoprolol  succinate (TOPROL-XL) 50 MG 24 hr tablet Take 2 tablets (100 mg total) by mouth daily. (Patient taking differently: Take 50 mg by mouth daily. ) 180 tablet 3  . Multiple Vitamins-Minerals (MULTIVITAMIN WITH MINERALS) tablet Take 1 tablet by mouth daily.    [name] (SYSTANE OP) Apply 1 drop to eye 2 (two) times daily.    . rivaroxaban (XARELTO) 20 MG TABS tablet Take 1 tablet (20 mg total) by mouth daily with supper. 30 tablet 11  . simvastatin (ZOCOR) 80 MG tablet Take 80 mg by mouth daily.    . timolol (TIMOPTIC) 0.25 % ophthalmic solution Place 1 drop into both eyes daily.     No current facility-administered medications for this visit.    Allergies:   Review of patient's allergies indicates no known allergies.    Social History:  The patient  reports that she has never smoked. She does not have any smokeless tobacco history on file. She reports that she does not drink alcohol or use illicit drugs.   Family History:  The patient's family hx with brother with CABG and died with MI.    ROS:  General:no colds or fevers, no weight changes Skin:no rashes or ulcers HEENT:no blurred vision, no congestion CV:see HPI PUL:see HPI GI:no diarrhea constipation or melena, no indigestion GU:no hematuria, no dysuria MS:no joint  pain, no claudication Neuro:no syncope, no lightheadedness Endo:no diabetes, no thyroid disease  Wt Readings from Last 3 Encounters:  12/15/14 107 lb 1.6 oz (48.58 kg)  05/23/14 111 lb 14.4 oz (50.758 kg)  05/05/14 110 lb (49.896 kg)     PHYSICAL EXAM: VS:  BP 124/66 mmHg  Pulse 56  Ht 4\' 11"  (1.499 m)  Wt 107 lb 1.6 oz (48.58 kg)  BMI 21.62 kg/m2 , BMI Body mass index is 21.62 kg/(m^2). General:Pleasant affect, NAD Skin:Warm and dry, brisk capillary refill HEENT:normocephalic, sclera clear, mucus membranes moist Neck:supple, no JVD, no bruits  Heart:S1S2 RRR without murmur, gallup, rub or click Lungs:clear without rales, rhonchi, or  wheezes EXB:MWUX, non tender, + BS, do not palpate liver spleen or masses Ext:no lower ext edema, 2+ pedal pulses, 2+ radial pulses Neuro:alert and oriented X 3, MAE, follows commands, + facial symmetry    EKG:  EKG is ordered today. The ekg ordered today demonstrates SB no acute changes.    Recent Labs: 03/07/2014: TSH 1.647 04/29/2014: BUN 14; Creat 0.76; Hemoglobin 14.5; Platelets 199; Potassium 4.4; Sodium 140    Lipid Panel No results found for: CHOL, TRIG, HDL, CHOLHDL, VLDL, LDLCALC, LDLDIRECT  followed by PCP, to be checked next month.   Other studies Reviewed: Additional studies/ records that were reviewed today include: previous note and cath.   ASSESSMENT AND PLAN:  1. PAF none that she is aware of- I was able to discuss with Dr. Adora Fridge and plan will be to have 30 day event monitor for a fib burden in 5 months and then follow up with JB in 6 months.  2 anticoagulation, instructed to refill and keep taking for now.    Current medicines are reviewed with the patient today.  The patient Has no concerns regarding medicines.  The following changes have been made:  See above Labs/ tests ordered today include:see above  Disposition:   FU:  see above  Lennie Muckle, NP  12/15/2014 2:29 PM    [facility] [place], [place], [place], [place] Phone: [phone]; Fax: [phone]

## 2014-12-15 NOTE — Patient Instructions (Signed)
Your physician wants you to follow-up in: [place] will receive a reminder letter in the mail two months in advance. If you don't receive a letter, please call our office to schedule the follow-up appointment. v

## 2014-12-21 ENCOUNTER — Telehealth: Payer: Self-pay | Admitting: *Deleted

## 2014-12-21 NOTE — Telephone Encounter (Signed)
-----   Message from Isaiah Serge, NP sent at 12/16/2014  5:32 PM EDT ----- Tami Bell can you let pt know that Dr. Adora Fridge wants her to continue xarelto and BB for now then please arrange a 30 day event monitor in 5 months for a fib burden then follow up with Dr. Adora Fridge 1 month later.

## 2014-12-21 NOTE — Telephone Encounter (Signed)
Left message for pt to call.

## 2014-12-22 NOTE — Telephone Encounter (Signed)
Spoke with pt, aware of dr berry's recommendations. Recall for event monitor placed

## 2014-12-22 NOTE — Telephone Encounter (Signed)
Mrs. Kohen is returning your call , please call ...  Thanks

## 2015-05-22 ENCOUNTER — Ambulatory Visit (INDEPENDENT_AMBULATORY_CARE_PROVIDER_SITE_OTHER): Payer: Medicare HMO

## 2015-05-22 DIAGNOSIS — I480 Paroxysmal atrial fibrillation: Secondary | ICD-10-CM

## 2015-05-23 ENCOUNTER — Telehealth: Payer: Self-pay | Admitting: Cardiovascular Disease

## 2015-05-23 NOTE — Telephone Encounter (Signed)
New MEssage  Rep from [facility] calling to follow up on paperwork they faxed concerning their advocacy program- for pt's Rx of xarelt- stated she is sending another packet today. Please call back and discuss.

## 2015-05-23 NOTE — Telephone Encounter (Signed)
Nurse has no information on this paperwork; Called back [facility] don hold for over 30 mins, no answer.

## 2015-06-01 ENCOUNTER — Other Ambulatory Visit: Payer: Self-pay

## 2015-06-01 MED ORDER — RIVAROXABAN 20 MG PO TABS: 20.0000 mg | ORAL_TABLET | Freq: Every day | ORAL | Status: DC

## 2015-06-01 NOTE — Telephone Encounter (Signed)
Paperwork mailed with copy of Rx.

## 2015-06-13 ENCOUNTER — Telehealth: Payer: Self-pay | Admitting: Cardiovascular Disease

## 2015-06-13 NOTE — Telephone Encounter (Signed)
Caryl Pina is calling because they have paper work for the patient Xarelto , and its missing Dr. Gwenlyn Found Tax ID # . Please call at [phone] Option 2  Thanks

## 2015-06-13 NOTE — Telephone Encounter (Signed)
Called and pressed option 2 as indicated, goes to message stating to call back on next business day.

## 2015-06-14 NOTE — Telephone Encounter (Signed)
Called, dialed for provider option, goes to extended hold w/ no answer.

## 2015-06-15 NOTE — Telephone Encounter (Signed)
Called- used phone option listed No one answered phone

## 2015-06-16 ENCOUNTER — Ambulatory Visit: Payer: Medicare HMO | Admitting: Cardiovascular Disease

## 2015-06-27 ENCOUNTER — Ambulatory Visit (INDEPENDENT_AMBULATORY_CARE_PROVIDER_SITE_OTHER): Payer: Medicare HMO | Admitting: Cardiovascular Disease

## 2015-06-27 ENCOUNTER — Encounter: Payer: Self-pay | Admitting: Cardiovascular Disease

## 2015-06-27 VITALS — BP 110/52 | HR 62 | Ht 59.0 in | Wt 104.8 lb

## 2015-06-27 DIAGNOSIS — E785 Hyperlipidemia, unspecified: Secondary | ICD-10-CM | POA: Diagnosis not present

## 2015-06-27 DIAGNOSIS — I480 Paroxysmal atrial fibrillation: Secondary | ICD-10-CM | POA: Diagnosis not present

## 2015-06-27 NOTE — Assessment & Plan Note (Signed)
History of hyperlipidemia on statin therapy followed by her PCP. 

## 2015-06-27 NOTE — Assessment & Plan Note (Signed)
History of paroxysmal atrial fibrillation in 2015 on tXarelto t maintaining sinus rhythm. A recent monitor showed sinus rhythm without evidence of PAF. I have told her she can stop her oral anticoagulation

## 2015-06-27 NOTE — Patient Instructions (Signed)
Your physician wants you to follow-up in: 1 Year. You will receive a reminder letter in the mail two months in advance. If you don't receive a letter, please call our office to schedule the follow-up appointment.  Your physician has recommended you make the following change in your medication: STOP Xarelto

## 2015-06-27 NOTE — Progress Notes (Signed)
06/27/2015 Tami Bell   Jan 04, 1941  [id number]  Primary Physician Gilford Rile, MD Primary Cardiologist: [name] [name]   HPI:  Tami Bell is a delightful 75 year old mildly overweight married Caucasian female who states husband Tami Bell is also a patient of mine and who accompanies her today. I last saw her 05/23/14 She is the mother of 3, grandmother and 5 grandchildren. She was referred by [name] in [facility] for evaluation treatment of new onset symptomatic atrial fibrillation. Her only cardiovascular risk factor is treated hyperlipidemia. She does have an older brother who died of a myocardial infarction and had bypass surgery. She has never had a heart attack or stroke. She denies chest pain but has had fairly recent increase in dyspnea on exertion. There is no history of bleeding problems. She noticed increased heart rate and fatigue 2-3 weeks prior to seeing me late last yearand saw her primary care physician who documented new onset A. Fib with RVR. He began low-dose beta blocker and oral anticoagulation resulting in improvement in her symptoms. Routine lab work was performed and apparently her thyroid function tests were normal.she was hospitalized in December with A. Fib with RVR and spontaneously converted to sinus rhythm. She was complaining of weakness and increasing dyspnea on exertion. Recent Myoview stress test showed no ischemia but had decreased ejection fraction and 2-D echo revealed an EF of 45-50% with mild global hypokinesia. I performed outpatient cardiac catheterization on her 05/05/14 via the right radial approach revealing normal coronary arteries and low normal LV function. Since that time her dyspnea has markedly improved. She recently wore a 1 month event monitor that showed no evidence of PAF and as a result I have decided to stop her Xarelto .    Current Outpatient Prescriptions  Medication Sig Dispense Refill  . ALPRAZolam  (XANAX) 0.25 MG tablet Take 0.5 tablets by mouth as needed.    . bimatoprost (LUMIGAN) 0.03 % ophthalmic solution Place 1 drop into both eyes at bedtime.    . Calcium Carbonate-Vitamin D (CALCIUM + D PO) Take 1 tablet by mouth daily.    . citalopram (CELEXA) 10 MG tablet Take 10 mg by mouth daily.    . dorzolamide (TRUSOPT) 2 % ophthalmic solution Place 1 drop into both eyes 2 (two) times daily.     . metoprolol succinate (TOPROL-XL) 50 MG 24 hr tablet Take 1 tablet (50 mg total) by mouth daily. 90 tablet 3  . Multiple Vitamins-Minerals (MULTIVITAMIN WITH MINERALS) tablet Take 1 tablet by mouth daily.    [name] (SYSTANE OP) Apply 1 drop to eye 2 (two) times daily.    . simvastatin (ZOCOR) 80 MG tablet Take 80 mg by mouth daily.    . timolol (TIMOPTIC) 0.25 % ophthalmic solution Place 1 drop into both eyes daily.     No current facility-administered medications for this visit.    No Known Allergies  Social History   Social History  . Marital Status: Married    Spouse Name: N/A  . Number of Children: N/A  . Years of Education: N/A   Occupational History  . Not on file.   Social History Main Topics  . Smoking status: Never Smoker   . Smokeless tobacco: Never Used  . Alcohol Use: No  . Drug Use: No  . Sexual Activity: Not on file   Other Topics Concern  . Not on file   Social History Narrative  Review of Systems: General: negative for chills, fever, night sweats or weight changes.  Cardiovascular: negative for chest pain, dyspnea on exertion, edema, orthopnea, palpitations, paroxysmal nocturnal dyspnea or shortness of breath Dermatological: negative for rash Respiratory: negative for cough or wheezing Urologic: negative for hematuria Abdominal: negative for nausea, vomiting, diarrhea, bright red blood per rectum, melena, or hematemesis Neurologic: negative for visual changes, syncope, or dizziness All other systems reviewed and are otherwise  negative except as noted above.    Blood pressure 110/52, pulse 62, height 4\' 11"  (1.499 m), weight 104 lb 12.8 oz (47.537 kg).  General appearance: alert and no distress Neck: no adenopathy, no carotid bruit, no JVD, supple, symmetrical, trachea midline and thyroid not enlarged, symmetric, no tenderness/mass/nodules Lungs: clear to auscultation bilaterally Heart: regular rate and rhythm, S1, S2 normal, no murmur, click, rub or gallop Extremities: extremities normal, atraumatic, no cyanosis or edema  EKG normal sinus rhythm at 62 without ST or T-wave changes. I personally reviewed this EKG  ASSESSMENT AND PLAN:   Paroxysmal atrial fibrillation History of paroxysmal atrial fibrillation in 2015 on tXarelto t maintaining sinus rhythm. A recent monitor showed sinus rhythm without evidence of PAF. I have told her she can stop her oral anticoagulation  Hyperlipidemia History of hyperlipidemia on statin therapy followed by her PCP      [name] [facility], [facility] 06/27/2015 11:59 AM

## 2015-12-27 ENCOUNTER — Telehealth: Payer: Self-pay | Admitting: Cardiovascular Disease

## 2015-12-27 MED ORDER — METOPROLOL SUCCINATE ER 50 MG PO TB24: 50.0000 mg | ORAL_TABLET | Freq: Every day | ORAL | 1 refills | Status: DC

## 2015-12-27 NOTE — Telephone Encounter (Signed)
New message   Schedule an appt on 10/6 @ 3:15 for the pt with Dr. Gwenlyn Found.    Pt c/o Shortness Of Breath: STAT if SOB developed within the last 24 hours or pt is noticeably SOB on the phone  1. Are you currently SOB (can you hear that pt is SOB on the phone)? yes  2. How long have you been experiencing SOB? 2 weeks  3. Are you SOB when sitting or when up moving around? anytime  4. Are you currently experiencing any other symptoms? weakness

## 2015-12-27 NOTE — Telephone Encounter (Signed)
Pt experiencing symptoms of recurrent/intermittent shortness of breath, weakness - this has been going on since about 2 weeks ago. She wishes to see Dr. Gwenlyn Found and would rather not see PA. Aware of soonest available appt and fine w follow up in this time frame. Pt aware to call if new concerns & that we could probably work her in for PA appt sooner. Aware to go to ER for urgent/emergent symptoms. Renewed metoprolol to patient's preferred pharmacy after reviewing meds w her. Patient seemed to be unclear on which meds she was taking - notes pretty sure she was currently out of the metoprolol. I advised on use of this. Advised to monitor VS at home if able. Pt informs me she does not have home BP cuff. Will defer for discussion w provider Friday-- she may need to meet w pharmD for medication and/or BP management.

## 2015-12-29 ENCOUNTER — Ambulatory Visit (INDEPENDENT_AMBULATORY_CARE_PROVIDER_SITE_OTHER): Payer: Medicare HMO | Admitting: Cardiovascular Disease

## 2015-12-29 ENCOUNTER — Encounter: Payer: Self-pay | Admitting: Cardiovascular Disease

## 2015-12-29 VITALS — BP 142/80 | HR 52 | Ht 59.0 in | Wt 103.2 lb

## 2015-12-29 DIAGNOSIS — E7800 Pure hypercholesterolemia, unspecified: Secondary | ICD-10-CM | POA: Diagnosis not present

## 2015-12-29 DIAGNOSIS — I480 Paroxysmal atrial fibrillation: Secondary | ICD-10-CM

## 2015-12-29 NOTE — Patient Instructions (Signed)
Medication Instructions:  NO CHANGES.   Follow-Up: We request that you follow-up in: 6-8 WEEKS with an extender and in 6 MONTHS with Dr Andria Rhein will receive a reminder letter in the mail two months in advance. If you don't receive a letter, please call our office to schedule the follow-up appointment.    If you need a refill on your cardiac medications before your next appointment, please call your pharmacy.

## 2015-12-29 NOTE — Assessment & Plan Note (Signed)
History of hyperlipidemia on simvastatin followed by her PCP 

## 2015-12-29 NOTE — Telephone Encounter (Signed)
Patient here to see Dr Gwenlyn Found today.  Asked if she needed any further direction on her medications. She stated Dr Gwenlyn Found answered her questions. No further action needed at this time.

## 2015-12-29 NOTE — Progress Notes (Signed)
12/29/2015 Tami Bell   [birth year]  [id number]  Primary Physician Tami Rile, MD Primary Cardiologist: Tami Harp MD Tami Bell  HPI:  [name] is a delightful 75 year old mildly overweight married Caucasian female who states husband Tami Bell is also a patient of mine and who accompanies her today. I last saw her 06/27/15 She is the mother of 3, grandmother and 5 grandchildren. She was referred by Dr. Bea Bell in [facility] for evaluation treatment of new onset symptomatic atrial fibrillation. Her only cardiovascular risk factor is treated hyperlipidemia. She does have an older brother who died of a myocardial infarction and had bypass surgery. She has never had a heart attack or stroke. She denies chest pain but has had fairly recent increase in dyspnea on exertion. There is no history of bleeding problems. She noticed increased heart rate and fatigue 2-3 weeks prior to seeing me late last yearand saw her primary care physician who documented new onset A. Fib with RVR. He began low-dose beta blocker and oral anticoagulation resulting in improvement in her symptoms. Routine lab work was performed and apparently her thyroid function tests were normal.she was hospitalized in December with A. Fib with RVR and spontaneously converted to sinus rhythm. She was complaining of weakness and increasing dyspnea on exertion. Recent Myoview stress test showed no ischemia but had decreased ejection fraction and 2-D echo revealed an EF of 45-50% with mild global hypokinesia. I performed outpatient cardiac catheterization on her 05/05/14 via the right radial approach revealing normal coronary arteries and low normal LV function. Since that time her dyspnea has markedly improved. She wore a 1 month event monitor that showed no evidence of PAF and as a result I decided to stop her Xarelto . Since I saw her 6 months ago she's remained critically stable. She was having some episodes of weakness  and shortness of breath. However, since being on her beta blocker symptoms have somewhat improved.    Current Outpatient Prescriptions  Medication Sig Dispense Refill  . ALPRAZolam (XANAX) 0.25 MG tablet Take 0.5 tablets by mouth as needed.    . bimatoprost (LUMIGAN) 0.03 % ophthalmic solution Place 1 drop into both eyes at bedtime.    . Calcium Carbonate-Vitamin D (CALCIUM + D PO) Take 1 tablet by mouth daily.    . citalopram (CELEXA) 10 MG tablet Take 10 mg by mouth daily.    . dorzolamide (TRUSOPT) 2 % ophthalmic solution Place 1 drop into both eyes 2 (two) times daily.     . metoprolol succinate (TOPROL-XL) 50 MG 24 hr tablet Take 1 tablet (50 mg total) by mouth daily. 90 tablet 1  . Multiple Vitamins-Minerals (MULTIVITAMIN WITH MINERALS) tablet Take 1 tablet by mouth daily.    Tami Bell Glycol-Propyl Glycol (SYSTANE OP) Apply 1 drop to eye 2 (two) times daily.    . simvastatin (ZOCOR) 80 MG tablet Take 80 mg by mouth daily.    . timolol (TIMOPTIC) 0.25 % ophthalmic solution Place 1 drop into both eyes daily.     No current facility-administered medications for this visit.     No Known Allergies  Social History   Social History  . Marital status: Married    Spouse name: N/A  . Number of children: N/A  . Years of education: N/A   Occupational History  . Not on file.   Social History Main Topics  . Smoking status: Never Smoker  . Smokeless tobacco: Never Used  . Alcohol use  No  . Drug use: No  . Sexual activity: Not on file   Other Topics Concern  . Not on file   Social History Narrative  . No narrative on file     Review of Systems: General: negative for chills, fever, night sweats or weight changes.  Cardiovascular: negative for chest pain, dyspnea on exertion, edema, orthopnea, palpitations, paroxysmal nocturnal dyspnea or shortness of breath Dermatological: negative for rash Respiratory: negative for cough or wheezing Urologic: negative for  hematuria Abdominal: negative for nausea, vomiting, diarrhea, bright red blood per rectum, melena, or hematemesis Neurologic: negative for visual changes, syncope, or dizziness All other systems reviewed and are otherwise negative except as noted above.    Blood pressure (!) 142/80, pulse (!) 52, height 4\' 11"  (1.499 m), weight 103 lb 3.2 oz (46.8 kg).  General appearance: alert and no distress Neck: no adenopathy, no carotid bruit, no JVD, supple, symmetrical, trachea midline and thyroid not enlarged, symmetric, no tenderness/mass/nodules Lungs: clear to auscultation bilaterally Heart: regular rate and rhythm, S1, S2 normal, no murmur, click, rub or gallop Extremities: extremities normal, atraumatic, no cyanosis or edema  EKG sinus bradycardia 52 with incomplete right bundle-branch block. I personally  reviewed this EKG  ASSESSMENT AND PLAN:   Paroxysmal atrial fibrillation History of paroxysmal atrial fib maintaining sinus rhythm/sinus bradycardia on beta blocker  Hyperlipidemia History of hyperlipidemia on simvastatin followed by her PCP      Tami Harp MD [facility], [facility] 12/29/2015 3:56 PM

## 2015-12-29 NOTE — Assessment & Plan Note (Signed)
History of paroxysmal atrial fib maintaining sinus rhythm/sinus bradycardia on beta blocker

## 2016-02-14 ENCOUNTER — Encounter: Payer: Self-pay | Admitting: *Deleted

## 2016-02-20 ENCOUNTER — Encounter: Payer: Self-pay | Admitting: Cardiology

## 2016-02-20 ENCOUNTER — Ambulatory Visit (INDEPENDENT_AMBULATORY_CARE_PROVIDER_SITE_OTHER): Payer: Medicare HMO | Admitting: Cardiology

## 2016-02-20 VITALS — BP 136/73 | HR 58 | Ht 59.0 in | Wt 105.6 lb

## 2016-02-20 DIAGNOSIS — I480 Paroxysmal atrial fibrillation: Secondary | ICD-10-CM

## 2016-02-20 DIAGNOSIS — E785 Hyperlipidemia, unspecified: Secondary | ICD-10-CM

## 2016-02-20 NOTE — Assessment & Plan Note (Signed)
On statin Rx 

## 2016-02-20 NOTE — Progress Notes (Signed)
02/20/2016 Tami Bell   02-16-1941  [id number]  Primary Physician Tami Rile, MD Primary Cardiologist: Tami Bell  HPI:  75 year old mildly overweight married Caucasian female who states husband Tami Bell is also a patient of Tami Bell and who accompanies her today. She was referred by Tami. Bea Graff in Dec 2015 for evaluation treatment of new onset symptomatic atrial fibrillation. Her only cardiovascular risk factor is treated hyperlipidemia. She does have an older brother who died of a myocardial infarction and had bypass surgery. She has never had a heart attack or stroke. She denies chest pain or palpitations since her OV with Tami Bell in Oct 2017. She had a Myoview in Jan 2016 that showed no ischemia but had decreased ejection fraction.  A 2-D echo 04/21/14 revealed an EF of 45-50% with mild global hypokinesia. Outpatient cardiac catheterization on 05/05/14  revealed normal coronary arteries and low normal LV function. She wore a 1 month event monitor in Feb 2017 that showed no evidence of PAF and as a result Tami Bell decided to stop her Xarelto. She has had no palpitations since her last OV.  She denies any near syncope or syncope.    Current Outpatient Prescriptions  Medication Sig Dispense Refill  . ALPRAZolam (XANAX) 0.25 MG tablet Take 0.5 tablets by mouth daily as needed for anxiety.     . bimatoprost (LUMIGAN) 0.03 % ophthalmic solution Place 1 drop into both eyes at bedtime.    . Calcium Carbonate-Vitamin D (CALCIUM + D PO) Take 1 tablet by mouth daily.    . citalopram (CELEXA) 10 MG tablet Take 10 mg by mouth daily.    . dorzolamide (TRUSOPT) 2 % ophthalmic solution Place 1 drop into both eyes 2 (two) times daily.     . metoprolol succinate (TOPROL-XL) 50 MG 24 hr tablet Take 1 tablet (50 mg total) by mouth daily. 90 tablet 1  . Multiple Vitamins-Minerals (MULTIVITAMIN WITH MINERALS) tablet Take 1 tablet by mouth daily.    [name] (SYSTANE OP) Apply 1 drop to eye 2  (two) times daily.    . simvastatin (ZOCOR) 80 MG tablet Take 80 mg by mouth daily.    . timolol (TIMOPTIC) 0.25 % ophthalmic solution Place 1 drop into both eyes 2 (two) times daily.      No current facility-administered medications for this visit.     No Known Allergies  Social History   Social History  . Marital status: Married    Spouse name: N/A  . Number of children: N/A  . Years of education: N/A   Occupational History  . Not on file.   Social History Main Topics  . Smoking status: Never Smoker  . Smokeless tobacco: Never Used  . Alcohol use No  . Drug use: No  . Sexual activity: Not on file   Other Topics Concern  . Not on file   Social History Narrative  . No narrative on file     Review of Systems: General: negative for chills, fever, night sweats or weight changes.  Cardiovascular: negative for chest pain, dyspnea on exertion, edema, orthopnea, palpitations, paroxysmal nocturnal dyspnea or shortness of breath Dermatological: negative for rash Respiratory: negative for cough or wheezing Urologic: negative for hematuria Abdominal: negative for nausea, vomiting, diarrhea, bright red blood per rectum, melena, or hematemesis Neurologic: negative for visual changes, syncope, or dizziness All other systems reviewed and are otherwise negative except as noted above.    Blood pressure 136/73, pulse (!) 58, height  4\' 11"  (1.499 m), weight 105 lb 9.6 oz (47.9 kg).  General appearance: alert, cooperative and no distress Neck: no JVD Lungs: clear to auscultation bilaterally Heart: regular rate and rhythm Extremities: extremities normal, atraumatic, no cyanosis or edema Neurologic: Grossly normal   ASSESSMENT AND PLAN:   Paroxysmal atrial fibrillation PAF Dec 2016 without recurrence Xarelto stopped  Dyslipidemia On statin Rx   PLAN  F/U with Tami Bell in 6 months. She knows to contact us if her HR is less than 50 or greater that 100.   Kerin Ransom  PA-C 02/20/2016 10:40 AM

## 2016-02-20 NOTE — Assessment & Plan Note (Addendum)
PAF Dec 2015 without recurrence- Xarelto stopped

## 2016-02-20 NOTE — Patient Instructions (Signed)
Your physician wants you to follow-up in: 6 MONTHS WITH DR BERRY You will receive a reminder letter in the mail two months in advance. If you don't receive a letter, please call our office to schedule the follow-up appointment.   If you need a refill on your cardiac medications before your next appointment, please call your pharmacy.  

## 2016-06-25 ENCOUNTER — Telehealth: Payer: Self-pay | Admitting: Cardiovascular Disease

## 2016-06-25 MED ORDER — METOPROLOL SUCCINATE ER 50 MG PO TB24: 50.0000 mg | ORAL_TABLET | Freq: Every day | ORAL | 6 refills | Status: DC

## 2016-06-25 NOTE — Telephone Encounter (Signed)
New message   *STAT* If patient is at the pharmacy, call can be transferred to refill team.   1. Which medications need to be refilled? (please list name of each medication and dose if known) metoprolol 50 mg  2. Which pharmacy/location (including street and city if local pharmacy) is medication to be sent to? [facility] [phone]  3. Do they need a 30 day or 90 day supply? 30 day

## 2016-06-25 NOTE — Telephone Encounter (Signed)
Rx sent electronically.  

## 2016-08-27 ENCOUNTER — Encounter: Payer: Self-pay | Admitting: Cardiovascular Disease

## 2016-08-27 ENCOUNTER — Ambulatory Visit (INDEPENDENT_AMBULATORY_CARE_PROVIDER_SITE_OTHER): Payer: Medicare HMO | Admitting: Cardiovascular Disease

## 2016-08-27 VITALS — BP 122/66 | HR 48 | Ht 59.0 in | Wt 105.2 lb

## 2016-08-27 DIAGNOSIS — E785 Hyperlipidemia, unspecified: Secondary | ICD-10-CM

## 2016-08-27 DIAGNOSIS — I480 Paroxysmal atrial fibrillation: Secondary | ICD-10-CM

## 2016-08-27 MED ORDER — METOPROLOL SUCCINATE ER 50 MG PO TB24: 50.0000 mg | ORAL_TABLET | Freq: Every day | ORAL | 11 refills | Status: DC

## 2016-08-27 NOTE — Patient Instructions (Signed)
Medication Instructions: Your physician recommends that you continue on your current medications as directed. Please refer to the Current Medication list given to you today.  Labwork: I will request recent lab work from Dr. Willette Pa office.  Follow-Up: Your physician wants you to follow-up in: 1 year with Dr. Gwenlyn Found. You will receive a reminder letter in the mail two months in advance. If you don't receive a letter, please call our office to schedule the follow-up appointment.  If you need a refill on your cardiac medications before your next appointment, please call your pharmacy.

## 2016-08-27 NOTE — Assessment & Plan Note (Signed)
History of paroxysmal atrial fibrillation maintaining sinus rhythm on beta blocker. I did stop her Xarelto  because of -30 day event monitor.

## 2016-08-27 NOTE — Progress Notes (Signed)
08/27/2016 Tami Bell   June 06, 1940  [id number]  Primary Physician [name], MD Primary Cardiologist: [name] [name]  HPI:  Tami Bell is a delightful 75 year old mildly overweight married Caucasian female who states husband Tami Bell is also a patient of mine and who accompanies her today. I last saw her 12/29/15 She is the mother of 3, grandmother and 5 grandchildren. She was referred by [name] in [facility] for evaluation treatment of new onset symptomatic atrial fibrillation. Her only cardiovascular risk factor is treated hyperlipidemia. She does have an older brother who died of a myocardial infarction and had bypass surgery. She has never had a heart attack or stroke. She denies chest pain but has had fairly recent increase in dyspnea on exertion. There is no history of bleeding problems. She noticed increased heart rate and fatigue 2-3 weeks prior to seeing me late last yearand saw her primary care physician who documented new onset A. Fib with RVR. He began low-dose beta blocker and oral anticoagulation resulting in improvement in her symptoms. Routine lab work was performed and apparently her thyroid function tests were normal.she was hospitalized in December with A. Fib with RVR and spontaneously converted to sinus rhythm. She was complaining of weakness and increasing dyspnea on exertion. Recent Myoview stress test showed no ischemia but had decreased ejection fraction and 2-D echo revealed an EF of 45-50% with mild global hypokinesia. I performed outpatient cardiac catheterization on her 05/05/14 via the right radial approach revealing normal coronary arteries and low normal LV function. Since that time her dyspnea has markedly improved. She wore a 1 month event monitor that showed no evidence of PAF and as a result I decided to stop her Xarelto . Since I saw her 6 months ago she's remained critically stable. She has had no further PAF. She denies  chest pain or shortness of breath.  Current Outpatient Prescriptions  Medication Sig Dispense Refill  . ALPRAZolam (XANAX) 0.25 MG tablet Take 0.5 tablets by mouth daily as needed for anxiety.     . bimatoprost (LUMIGAN) 0.03 % ophthalmic solution Place 1 drop into both eyes at bedtime.    . Calcium Carbonate-Vitamin D (CALCIUM + D PO) Take 1 tablet by mouth daily.    . citalopram (CELEXA) 10 MG tablet Take 10 mg by mouth daily.    . dorzolamide (TRUSOPT) 2 % ophthalmic solution Place 1 drop into both eyes 2 (two) times daily.     . metoprolol succinate (TOPROL-XL) 50 MG 24 hr tablet Take 1 tablet (50 mg total) by mouth daily. 30 tablet 6  . Multiple Vitamins-Minerals (MULTIVITAMIN WITH MINERALS) tablet Take 1 tablet by mouth daily.    [name] (SYSTANE OP) Apply 1 drop to eye 2 (two) times daily.    . simvastatin (ZOCOR) 80 MG tablet Take 80 mg by mouth daily.    . timolol (TIMOPTIC) 0.25 % ophthalmic solution Place 1 drop into both eyes 2 (two) times daily.      No current facility-administered medications for this visit.     No Known Allergies  Social History   Social History  . Marital status: Married    Spouse name: N/A  . Number of children: N/A  . Years of education: N/A   Occupational History  . Not on file.   Social History Main Topics  . Smoking status: Never Smoker  . Smokeless tobacco: Never Used  . Alcohol use  No  . Drug use: No  . Sexual activity: Not on file   Other Topics Concern  . Not on file   Social History Narrative  . No narrative on file     Review of Systems: General: negative for chills, fever, night sweats or weight changes.  Cardiovascular: negative for chest pain, dyspnea on exertion, edema, orthopnea, palpitations, paroxysmal nocturnal dyspnea or shortness of breath Dermatological: negative for rash Respiratory: negative for cough or wheezing Urologic: negative for hematuria Abdominal: negative for nausea,  vomiting, diarrhea, bright red blood per rectum, melena, or hematemesis Neurologic: negative for visual changes, syncope, or dizziness All other systems reviewed and are otherwise negative except as noted above.    Blood pressure 122/66, pulse (!) 48, height 4\' 11"  (1.499 m), weight 105 lb 3.2 oz (47.7 kg).  General appearance: alert and no distress Neck: no adenopathy, no carotid bruit, no JVD, supple, symmetrical, trachea midline and thyroid not enlarged, symmetric, no tenderness/mass/nodules Lungs: clear to auscultation bilaterally Heart: regular rate and rhythm, S1, S2 normal, no murmur, click, rub or gallop Extremities: extremities normal, atraumatic, no cyanosis or edema  EKG sinus bradycardia 48 with incomplete right bundle-branch block. I personally reviewed this EKG  ASSESSMENT AND PLAN:   Paroxysmal atrial fibrillation History of paroxysmal atrial fibrillation maintaining sinus rhythm on beta blocker. I did stop her Xarelto  because of -30 day event monitor.  Dyslipidemia History of dyslipidemia on statin therapy followed by her PCP      [name] [facility] 08/27/2016 10:48 AM

## 2016-08-27 NOTE — Assessment & Plan Note (Signed)
History of dyslipidemia on statin therapy followed by her PCP 

## 2016-10-31 IMAGING — CR DG CHEST 2V
2 series · 2 of 2 positions shown · non-contrast
Comparison: None.

CLINICAL DATA: Preoperative exam prior catheterization; several
week history of dyspnea on exertion

EXAM:
CHEST  2 VIEW

[view not recorded (1 of 2)]
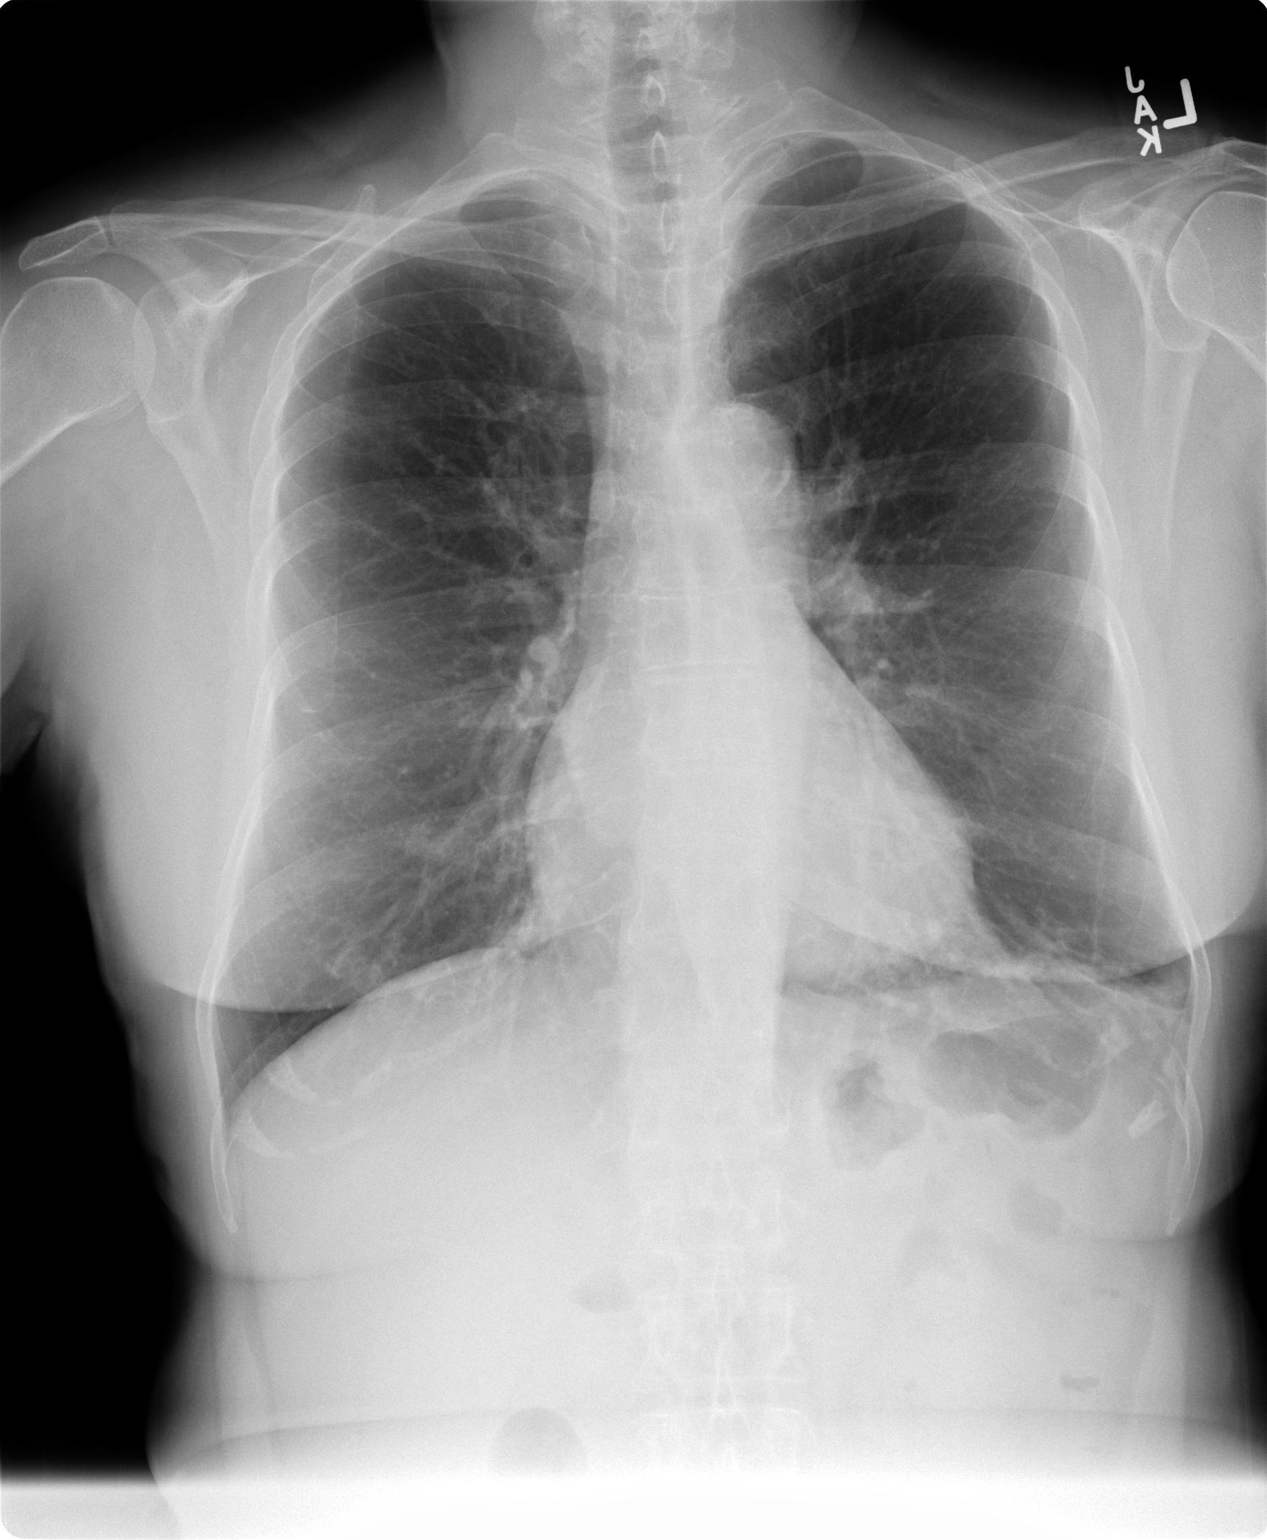

[view not recorded (2 of 2)]
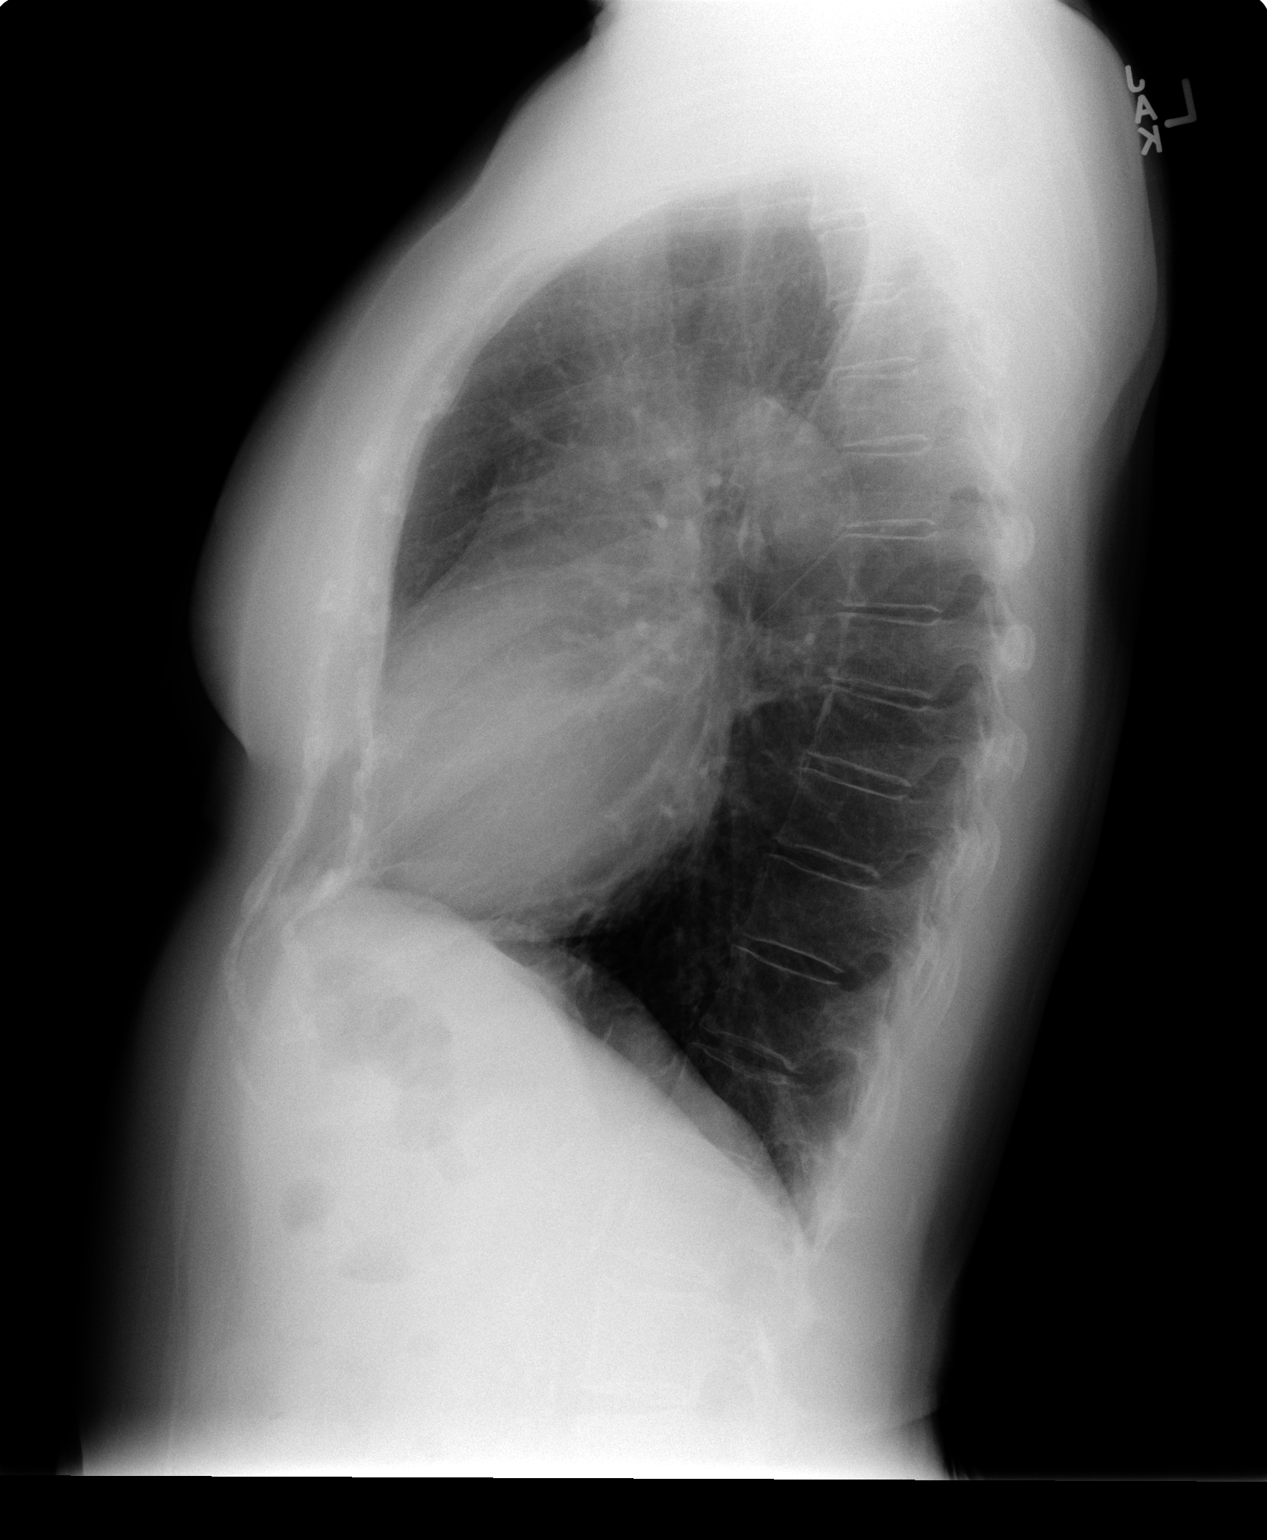

[2 of 2 positions shown; findings below may reference images not displayed]

FINDINGS: The lungs are mildly hyperinflated. There is subsegmental
atelectasis versus scarring at the left lung base. The heart and
pulmonary vascularity are normal. The mediastinum is normal in
width. There is calcification in the wall of the aortic arch and
descending thoracic aorta. The bony thorax is unremarkable
IMPRESSION: Mild hyperinflation is consistent with COPD or reactive airway
disease. There is left basilar atelectasis or scarring.

## 2016-12-14 DIAGNOSIS — R002 Palpitations: Secondary | ICD-10-CM

## 2016-12-14 DIAGNOSIS — Z8679 Personal history of other diseases of the circulatory system: Secondary | ICD-10-CM | POA: Diagnosis not present

## 2016-12-14 DIAGNOSIS — E785 Hyperlipidemia, unspecified: Secondary | ICD-10-CM | POA: Diagnosis not present

## 2016-12-14 DIAGNOSIS — F419 Anxiety disorder, unspecified: Secondary | ICD-10-CM | POA: Diagnosis not present

## 2016-12-14 DIAGNOSIS — H409 Unspecified glaucoma: Secondary | ICD-10-CM | POA: Diagnosis not present

## 2016-12-14 DIAGNOSIS — J189 Pneumonia, unspecified organism: Secondary | ICD-10-CM | POA: Diagnosis not present

## 2016-12-14 DIAGNOSIS — R0609 Other forms of dyspnea: Secondary | ICD-10-CM | POA: Diagnosis not present

## 2016-12-15 DIAGNOSIS — R000 Tachycardia, unspecified: Secondary | ICD-10-CM

## 2016-12-15 DIAGNOSIS — J189 Pneumonia, unspecified organism: Secondary | ICD-10-CM | POA: Diagnosis not present

## 2016-12-15 DIAGNOSIS — R002 Palpitations: Secondary | ICD-10-CM | POA: Diagnosis not present

## 2016-12-15 DIAGNOSIS — Z8679 Personal history of other diseases of the circulatory system: Secondary | ICD-10-CM | POA: Diagnosis not present

## 2016-12-16 DIAGNOSIS — J189 Pneumonia, unspecified organism: Secondary | ICD-10-CM | POA: Diagnosis not present

## 2016-12-16 DIAGNOSIS — R002 Palpitations: Secondary | ICD-10-CM | POA: Diagnosis not present

## 2016-12-16 DIAGNOSIS — Z8679 Personal history of other diseases of the circulatory system: Secondary | ICD-10-CM | POA: Diagnosis not present

## 2016-12-17 DIAGNOSIS — Z8679 Personal history of other diseases of the circulatory system: Secondary | ICD-10-CM | POA: Diagnosis not present

## 2016-12-17 DIAGNOSIS — J189 Pneumonia, unspecified organism: Secondary | ICD-10-CM | POA: Diagnosis not present

## 2016-12-17 DIAGNOSIS — R002 Palpitations: Secondary | ICD-10-CM | POA: Diagnosis not present

## 2016-12-19 ENCOUNTER — Encounter: Payer: Self-pay | Admitting: Cardiology

## 2016-12-19 ENCOUNTER — Ambulatory Visit (INDEPENDENT_AMBULATORY_CARE_PROVIDER_SITE_OTHER): Payer: Medicare HMO | Admitting: Cardiology

## 2016-12-19 VITALS — BP 138/72 | HR 78 | Ht 59.0 in | Wt 106.6 lb

## 2016-12-19 DIAGNOSIS — E7800 Pure hypercholesterolemia, unspecified: Secondary | ICD-10-CM | POA: Diagnosis not present

## 2016-12-19 DIAGNOSIS — J189 Pneumonia, unspecified organism: Secondary | ICD-10-CM

## 2016-12-19 DIAGNOSIS — E785 Hyperlipidemia, unspecified: Secondary | ICD-10-CM | POA: Diagnosis not present

## 2016-12-19 DIAGNOSIS — Z7901 Long term (current) use of anticoagulants: Secondary | ICD-10-CM

## 2016-12-19 DIAGNOSIS — I480 Paroxysmal atrial fibrillation: Secondary | ICD-10-CM

## 2016-12-19 HISTORY — DX: Long term (current) use of anticoagulants: Z79.01

## 2016-12-19 HISTORY — DX: Pneumonia, unspecified organism: J18.9

## 2016-12-19 NOTE — Progress Notes (Signed)
12/19/2016 Tami Bell   [birth year]  [id number]  Primary Physician [name], MD Primary Cardiologist: Dr Gwenlyn Found  HPI:  76 y/o female with a history of normal coronaries in 2016, past PAF, taken off Xarelto in feb 2017 after Holter showed no evidence of AF, and mild CM with an EF of 45-50% (in setting of frequent ectopy). She was recently admitted 9/22-9/25/18 with CAP. She presented to the ED with vague sense of uneasiness. Chest CT suggested pneumonia. During her hospitalization she had frequent PACs and "MAT" according to the discharge summary. Her Toprol was stopped and she was placed on low dose BID Diltiazem, apparently with good results.   She is in the office today for follow up. Overall she seems to be doing OK. She somewhat of a vague historian. There was confusion about her medications but we eventually sorted this out after we got her records from [facility]. I don't think she has had AF at home, sh is in NSR in the office today.    Current Outpatient Prescriptions  Medication Sig Dispense Refill  . ALPRAZolam (XANAX) 0.25 MG tablet Take 0.5 tablets by mouth daily as needed for anxiety.     Marland Kitchen azithromycin (ZITHROMAX) 500 MG tablet Take 1 tablet by mouth daily.    . bimatoprost (LUMIGAN) 0.03 % ophthalmic solution Place 1 drop into both eyes at bedtime.    . Calcium Carbonate-Vitamin D (CALCIUM + D PO) Take 1 tablet by mouth daily.    . cefdinir (OMNICEF) 300 MG capsule Take 1 capsule by mouth 2 (two) times daily.    . citalopram (CELEXA) 10 MG tablet Take 10 mg by mouth daily.    Marland Kitchen diltiazem (CARDIZEM) 30 MG tablet Take 1 tablet by mouth 2 (two) times daily.    . dorzolamide (TRUSOPT) 2 % ophthalmic solution Place 1 drop into both eyes 2 (two) times daily.     Marland Kitchen guaiFENesin (MUCINEX) 600 MG 12 hr tablet Take 1,200 mg by mouth as directed.    . Multiple Vitamins-Minerals (MULTIVITAMIN WITH MINERALS) tablet Take 1 tablet by mouth daily.    Vladimir Faster Glycol-Propyl  Glycol (SYSTANE OP) Apply 1 drop to eye 2 (two) times daily.    . simvastatin (ZOCOR) 80 MG tablet Take 80 mg by mouth daily.    . timolol (TIMOPTIC) 0.25 % ophthalmic solution Place 1 drop into both eyes 2 (two) times daily.     Alveda Reasons 20 MG TABS tablet Take 1 tablet by mouth daily.     No current facility-administered medications for this visit.     No Known Allergies  Past Medical History:  Diagnosis Date  . Dyspnea on exertion   . Hyperlipidemia   . Paroxysmal atrial fibrillation [facility])     Social History   Social History  . Marital status: Married    Spouse name: N/A  . Number of children: N/A  . Years of education: N/A   Occupational History  . Not on file.   Social History Main Topics  . Smoking status: Never Smoker  . Smokeless tobacco: Never Used  . Alcohol use No  . Drug use: No  . Sexual activity: Not on file   Other Topics Concern  . Not on file   Social History Narrative  . No narrative on file     Family History  Problem Relation Age of Onset  . Heart attack Brother   . Heart disease Brother      Review  of Systems: General: negative for chills, fever, night sweats or weight changes.  Cardiovascular: negative for chest pain, dyspnea on exertion, edema, orthopnea, palpitations, paroxysmal nocturnal dyspnea or shortness of breath Dermatological: negative for rash Respiratory: negative for cough or wheezing Urologic: negative for hematuria Abdominal: negative for nausea, vomiting, diarrhea, bright red blood per rectum, melena, or hematemesis Neurologic: negative for visual changes, syncope, or dizziness All other systems reviewed and are otherwise negative except as noted above.    Blood pressure 138/72, pulse 78, height 4\' 11"  (1.499 m), weight 106 lb 9.6 oz (48.4 kg).  General appearance: alert, cooperative and no distress Neck: no carotid bruit and no JVD Lungs: clear to auscultation bilaterally Heart: regular rate and  rhythm Extremities: extremities normal, atraumatic, no cyanosis or edema Skin: Skin color, texture, turgor normal. No rashes or lesions Neurologic: Grossly normal  EKG NSR, 78 incomplete RBBB  TSH and BMP were normal at [place]:   Paroxysmal atrial fibrillation Suspected recurrent PAF during recent hospitalization at [facility]- though clear cut AF was not seen, frequent PACs and "MAT".   CAP (community acquired pneumonia) Hospitalized 9/22-9/25/18- [place] started during her recent admission   PLAN  Will ask Dr Gwenlyn Found to review. She may very well have had PAF in setting of CAP. Will continue Xarelto for now. Consider changing Zocor to Lipitor in case we need to increase Diltiazem.  Kerin Ransom PA-C 12/19/2016 4:21 PM

## 2016-12-19 NOTE — Patient Instructions (Addendum)
Medication Instructions:  Your physician recommends that you continue on your current medications as directed. Please refer to the Current Medication list given to you today.   Labwork: None ordered  Testing/Procedures: None ordered  Follow-Up: Your physician recommends that you schedule a follow-up appointment in: 3 MONTHS WITH DR. Gwenlyn Found   Any Other Special Instructions Will Be Listed Below (If Applicable).     If you need a refill on your cardiac medications before your next appointment, please call your pharmacy.

## 2016-12-19 NOTE — Assessment & Plan Note (Signed)
Hospitalized 9/22-9/25/18[facility]

## 2016-12-19 NOTE — Assessment & Plan Note (Signed)
Xarelto started during her recent admission

## 2016-12-19 NOTE — Assessment & Plan Note (Signed)
Suspected recurrent PAF during recent hospitalization at [facility]- though clear cut AF was not seen, frequent PACs and "MAT".

## 2017-01-22 ENCOUNTER — Ambulatory Visit: Payer: Medicare HMO | Admitting: Cardiovascular Disease

## 2017-02-25 ENCOUNTER — Other Ambulatory Visit: Payer: Self-pay | Admitting: Cardiovascular Disease

## 2017-02-25 MED ORDER — XARELTO 20 MG PO TABS: 20.0000 mg | ORAL_TABLET | Freq: Every day | ORAL | 5 refills | Status: DC

## 2017-02-25 NOTE — Telephone Encounter (Signed)
Patient called and made aware that she is to continue current meds per last PA note. Rx(s) sent to pharmacy electronically.

## 2017-02-25 NOTE — Telephone Encounter (Signed)
Pt needs to know if she needs to continue taking Xarelto? If so,she will need a new prescription. Please call this to [facility] [phone].Please let pt know if she is to continue taking the Xarelto,

## 2017-03-11 ENCOUNTER — Ambulatory Visit: Payer: Medicare HMO | Admitting: Cardiovascular Disease

## 2017-03-11 ENCOUNTER — Encounter: Payer: Self-pay | Admitting: Cardiovascular Disease

## 2017-03-11 DIAGNOSIS — E785 Hyperlipidemia, unspecified: Secondary | ICD-10-CM | POA: Diagnosis not present

## 2017-03-11 DIAGNOSIS — I480 Paroxysmal atrial fibrillation: Secondary | ICD-10-CM | POA: Diagnosis not present

## 2017-03-11 MED ORDER — DILTIAZEM HCL 30 MG PO TABS: 30.0000 mg | ORAL_TABLET | Freq: Two times a day (BID) | ORAL | 6 refills | Status: DC

## 2017-03-11 NOTE — Patient Instructions (Addendum)
Medication Instructions: Your physician recommends that you continue on your current medications as directed. Please refer to the Current Medication list given to you today.  STOP Xarelto  Continue Simvastatin 20 mg daily  Labwork: Your physician recommends that you return for a FASTING lipid profile and hepatic function panel in 2 months.  Follow-Up: We request that you follow-up in: 6 months with Kerin Ransom, PA and in 12 months with Dr Andria Rhein will receive a reminder letter in the mail two months in advance. If you don't receive a letter, please call our office to schedule the follow-up appointment.  If you need a refill on your cardiac medications before your next appointment, please call your pharmacy.

## 2017-03-11 NOTE — Progress Notes (Signed)
03/11/2017 Tami Bell   October 06, 1940  [id number]  Primary Physician Tami Bell., MD Primary Cardiologist: [name] Tami Bell, [place]  HPI:  Tami Bell is a 75 y.o.  mildly overweight married Caucasian female who states husband Tami Bell is also a patient of mine and who accompanies her today. I last saw her  08/27/16  She is the mother of 3, grandmother and 5 grandchildren. She was referred by Dr. Bea Bell in [facility] for evaluation treatment of new onset symptomatic atrial fibrillation. Her only cardiovascular risk factor is treated hyperlipidemia. She does have an older brother who died of a myocardial infarction and had bypass surgery. She has never had a heart attack or stroke. She denies chest pain but has had fairly recent increase in dyspnea on exertion. There is no history of bleeding problems. She noticed increased heart rate and fatigue 2-3 weeks prior to seeing me late last yearand saw her primary care physician who documented new onset A. Fib with RVR. He began low-dose beta blocker and oral anticoagulation resulting in improvement in her symptoms. Routine lab work was performed and apparently her thyroid function tests were normal.she was hospitalized in December with A. Fib with RVR and spontaneously converted to sinus rhythm. She was complaining of weakness and increasing dyspnea on exertion. Recent Myoview stress test showed no ischemia but had decreased ejection fraction and 2-D echo revealed an EF of 45-50% with mild global hypokinesia. I performed outpatient cardiac catheterization on her 05/05/14 via the right radial approach revealing normal coronary arteries and low normal LV function. Since that time her dyspnea has markedly improved. She wore a 1 month event monitor that showed no evidence of PAF and as a result I decided to stop her Xarelto . Since I saw her 6 months ago she was hospitalized for daily quite pneumonia was noted to have PACs and MAT  but no PAF was demonstrated.    Current Meds  Medication Sig  . ALPRAZolam (XANAX) 0.25 MG tablet Take 0.5 tablets by mouth daily as needed for anxiety.   Marland Kitchen azithromycin (ZITHROMAX) 500 MG tablet Take 1 tablet by mouth daily.  . bimatoprost (LUMIGAN) 0.03 % ophthalmic solution Place 1 drop into both eyes at bedtime.  . Calcium Carbonate-Vitamin D (CALCIUM + D PO) Take 1 tablet by mouth daily.  . cefdinir (OMNICEF) 300 MG capsule Take 1 capsule by mouth 2 (two) times daily.  . citalopram (CELEXA) 10 MG tablet Take 10 mg by mouth daily.  Marland Kitchen diltiazem (CARDIZEM) 30 MG tablet Take 1 tablet by mouth 2 (two) times daily.  . dorzolamide (TRUSOPT) 2 % ophthalmic solution Place 1 drop into both eyes 2 (two) times daily.   . Multiple Vitamins-Minerals (MULTIVITAMIN WITH MINERALS) tablet Take 1 tablet by mouth daily.  Tami Bell Glycol-Propyl Glycol (SYSTANE OP) Apply 1 drop to eye 2 (two) times daily.  . simvastatin (ZOCOR) 20 MG tablet Take 20 mg by mouth daily.  . timolol (TIMOPTIC) 0.25 % ophthalmic solution Place 1 drop into both eyes 2 (two) times daily.   Alveda Reasons 20 MG TABS tablet Take 1 tablet (20 mg total) by mouth daily.  . [DISCONTINUED] simvastatin (ZOCOR) 80 MG tablet Take 80 mg by mouth daily.     No Known Allergies  Social History   Socioeconomic History  . Marital status: Married    Spouse name: Not on file  . Number of children: Not on file  . Years  of education: Not on file  . Highest education level: Not on file  Social Needs  . Financial resource strain: Not on file  . Food insecurity - worry: Not on file  . Food insecurity - inability: Not on file  . Transportation needs - medical: Not on file  . Transportation needs - non-medical: Not on file  Occupational History  . Not on file  Tobacco Use  . Smoking status: Never Smoker  . Smokeless tobacco: Never Used  Substance and Sexual Activity  . Alcohol use: No    Alcohol/week: 0.0 oz  . Drug use: No  . Sexual  activity: Not on file  Other Topics Concern  . Not on file  Social History Narrative  . Not on file     Review of Systems: General: negative for chills, fever, night sweats or weight changes.  Cardiovascular: negative for chest pain, dyspnea on exertion, edema, orthopnea, palpitations, paroxysmal nocturnal dyspnea or shortness of breath Dermatological: negative for rash Respiratory: negative for cough or wheezing Urologic: negative for hematuria Abdominal: negative for nausea, vomiting, diarrhea, bright red blood per rectum, melena, or hematemesis Neurologic: negative for visual changes, syncope, or dizziness All other systems reviewed and are otherwise negative except as noted above.    Blood pressure 130/68, pulse (!) 59, height 4\' 11"  (1.499 m), weight 107 lb (48.5 kg).  General appearance: alert and no distress Neck: no adenopathy, no carotid bruit, no JVD, supple, symmetrical, trachea midline and thyroid not enlarged, symmetric, no tenderness/mass/nodules Lungs: clear to auscultation bilaterally Heart: regular rate and rhythm, S1, S2 normal, no murmur, click, rub or gallop Extremities: extremities normal, atraumatic, no cyanosis or edema Pulses: 2+ and symmetric Skin: Skin color, texture, turgor normal. No rashes or lesions Neurologic: Alert and oriented X 3, normal strength and tone. Normal symmetric reflexes. Normal coordination and gait  EKG not performed today  ASSESSMENT AND PLAN:   Paroxysmal atrial fibrillation Patient who had previously been Xarelto however a recent event monitor did not show this . In addition, a recent admission for community-acquired pneumonia was notable for PACs and MAT but no PAF. She is on diltiazem twice a day.  Dyslipidemia History of dyslipidemia on statin therapy followed by her PCP      [name] [facility], [facility] 03/11/2017 11:05 AM

## 2017-03-11 NOTE — Assessment & Plan Note (Signed)
Patient who had previously been Xarelto however a recent event monitor did not show this . In addition, a recent admission for community-acquired pneumonia was notable for PACs and MAT but no PAF. She is on diltiazem twice a day.

## 2017-03-11 NOTE — Assessment & Plan Note (Signed)
History of dyslipidemia on statin therapy followed by her PCP 

## 2017-03-11 NOTE — Addendum Note (Signed)
Addended by: Therisa Doyne on: 03/11/2017 11:18 AM   Modules accepted: Orders

## 2017-10-22 ENCOUNTER — Encounter: Payer: Self-pay | Admitting: Gastroenterology

## 2017-10-28 ENCOUNTER — Telehealth: Payer: Self-pay

## 2017-10-28 ENCOUNTER — Ambulatory Visit (INDEPENDENT_AMBULATORY_CARE_PROVIDER_SITE_OTHER): Payer: Medicare HMO | Admitting: Gastroenterology

## 2017-10-28 ENCOUNTER — Ambulatory Visit: Payer: Medicare HMO

## 2017-10-28 VITALS — Ht 59.0 in | Wt 105.0 lb

## 2017-10-28 VITALS — BP 132/66 | HR 60 | Ht 59.0 in | Wt 105.0 lb

## 2017-10-28 DIAGNOSIS — Z86010 Personal history of colonic polyps: Secondary | ICD-10-CM

## 2017-10-28 DIAGNOSIS — Z7901 Long term (current) use of anticoagulants: Secondary | ICD-10-CM | POA: Diagnosis not present

## 2017-10-28 DIAGNOSIS — I4891 Unspecified atrial fibrillation: Secondary | ICD-10-CM | POA: Diagnosis not present

## 2017-10-28 MED ORDER — SUPREP BOWEL PREP KIT 17.5-3.13-1.6 GM/177ML PO SOLN: ORAL | 0 refills | Status: DC

## 2017-10-28 NOTE — Progress Notes (Unsigned)
Denies allergies to eggs or soy products. Denies complication of anesthesia or sedation. Denies use of weight loss medication. Denies use of O2.   Emmi instructions declined.   During PV I saw that the patient is taking 20 mg of Xarelto. Patient is scheduled for a colonoscopy on 11/05/17. Dr. Havery Moros is going to see Tami Bell in the office today so that she can hopefully keep her scheduled procedure time.   [name], LPN

## 2017-10-28 NOTE — Telephone Encounter (Signed)
Need to clarify if patient is taking Xarelto still. It's listed on her medication list, however appears as though Dr Gwenlyn Found discontinued this at last office visit in December 2018 and medication list just may not have been updated.

## 2017-10-28 NOTE — Patient Instructions (Signed)
If you are age 76 or older, your body mass index should be between 23-30. Your Body mass index is 21.21 kg/m. If this is out of the aforementioned range listed, please consider follow up with your Primary Care Provider.  If you are age 84 or younger, your body mass index should be between 19-25. Your Body mass index is 21.21 kg/m. If this is out of the aformentioned range listed, please consider follow up with your Primary Care Provider.   You have been scheduled for a colonoscopy. Please follow written instructions given to you at your visit today.  Please pick up your prep supplies at the pharmacy within the next 1-3 days. If you use inhalers (even only as needed), please bring them with you on the day of your procedure. Your physician has requested that you go to www.startemmi.com and enter the access code given to you at your visit today. This web site gives a general overview about your procedure. However, you should still follow specific instructions given to you by our office regarding your preparation for the procedure.  You will be contacted by our office prior to your procedure for directions on holding your Xarelto 2 days prior to your procedure. You can take aspirin on the days you do not take Xarelto.  If you do not hear from our office 1 week prior to your scheduled procedure, please call [phone] to discuss.   Thank you for entrusting me with your care and for choosing [facility], Dr. [place] Cellar

## 2017-10-28 NOTE — Telephone Encounter (Signed)
[facility] Pre-operative Risk Assessment     Request for surgical clearance:     Endoscopy Procedure  What type of surgery is being performed?     Colonoscopy   When is this surgery scheduled?    11-05-17  What type of clearance is required ?   Pharmacy  Are there any medications that need to be held prior to surgery and how long? Xarelto 2 days  Practice name and name of physician performing surgery?  [facility]. Please route your response to Lemar Lofty, CMA  What is your office phone and fax number?      Phone- [phone]  [phone]  Anesthesia type (None, local, MAC, general) ?       MAC  Thank you.

## 2017-10-28 NOTE — Progress Notes (Signed)
HPI :  77 year old female with a history of paroxysmal atrial fibrillation on Xarelto, HLD, history of colon polyps, here for a clinic visit to be evaluated for appropriateness for colonoscopy in light of her anticoagulation and other medical problems. She is a patient of Dr. Steve Rattler from [place].  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 Her last colonoscopy was in 2005 which was normal. She has not had any surveillance since that time. He denies any new bowel changes. She has regular bowel movements without constipation or diarrhea. No blood in her stools. No abdominal pains. No nausea or vomiting, she is eating well. Overall she is feeling well. She's been on the relative for history of atrial fibrillation for a long time. She denies any history of stroke heart attack related to A. Fib. She denies any shortness of breath or chest pain. Her last echocardiogram was performed in 2016 showing EF of 45-50%.  She brings in labs from her primary care today, done on 04/11/17 showing BUN 12, Cr 0.97, GFR 67.  Hgb 14.5, plt 195, WBC 5.7, MCV 99   Colonoscopy 10/26/2003 - Dr. Lyndel Safe - pancolonic diverticulosis, no polyps Colonoscopy 08/25/2000 - large R sided adenoma (4.5cm) in size, removed surgically  Echo 04/21/2014 - EF 45-50%  Past Medical History:  Diagnosis Date  . Cataract   . Dyspnea on exertion   . Glaucoma   . Hyperlipidemia   . Osteoporosis   . Paroxysmal atrial fibrillation [facility])      Past Surgical History:  Procedure Laterality Date  . CARDIOVERSION N/A 03/11/2014   Procedure: CARDIOVERSION;  Surgeon: Sanda Klein, MD;  Location: [facility];  Service: Cardiovascular;  Laterality: N/A;  . LEFT HEART CATHETERIZATION WITH CORONARY ANGIOGRAM N/A 05/05/2014   Procedure: LEFT HEART CATHETERIZATION WITH CORONARY ANGIOGRAM;  Surgeon: Lorretta Harp, MD;  Location: [facility] CATH LAB;  Service: Cardiovascular;  Laterality: N/A;   Family  History  Problem Relation Age of Onset  . Heart attack Brother   . Heart disease Brother   . Colon cancer Neg Hx   . Esophageal cancer Neg Hx   . Rectal cancer Neg Hx   . Stomach cancer Neg Hx    Social History   Tobacco Use  . Smoking status: Never Smoker  . Smokeless tobacco: Never Used  Substance Use Topics  . Alcohol use: No    Alcohol/week: 0.0 oz  . Drug use: No   Current Outpatient Medications  Medication Sig Dispense Refill  . ALPRAZolam (XANAX) 0.25 MG tablet Take 0.5 tablets by mouth daily as needed for anxiety.     . bimatoprost (LUMIGAN) 0.03 % ophthalmic solution Place 1 drop into both eyes at bedtime.    . Calcium Carbonate-Vitamin D (CALCIUM + D PO) Take 1 tablet by mouth daily.    . cefdinir (OMNICEF) 300 MG capsule Take 1 capsule by mouth 2 (two) times daily.    . citalopram (CELEXA) 10 MG tablet Take 10 mg by mouth daily.    Marland Kitchen diltiazem (CARDIZEM) 30 MG tablet Take 1 tablet (30 mg total) by mouth 2 (two) times daily. 60 tablet 6  . dorzolamide (TRUSOPT) 2 % ophthalmic solution Place 1 drop into both eyes 2 (two) times daily.     . Multiple Vitamins-Minerals (MULTIVITAMIN WITH MINERALS) tablet Take 1 tablet by mouth daily.    [name] (SYSTANE OP) Apply 1 drop to eye 2 (two) times daily.    Marland Kitchen  rivaroxaban (XARELTO) 20 MG TABS tablet Take 20 mg by mouth daily with supper.    . simvastatin (ZOCOR) 20 MG tablet Take 20 mg by mouth daily.    Manus Gunning BOWEL PREP KIT 17.5-3.13-1.6 GM/177ML SOLN Suprep-Use as directed 354 mL 0  . timolol (TIMOPTIC) 0.25 % ophthalmic solution Place 1 drop into both eyes 2 (two) times daily.      No current facility-administered medications for this visit.    No Known Allergies   Review of Systems: All systems reviewed and negative except where noted in HPI.    Labs per HPI  Physical Exam: BP 132/66   Pulse 60   Ht '4\' 11"'  (1.499 m)   Wt 105 lb (47.6 kg)   BMI 21.21 kg/m  Constitutional:  Pleasant,well-developed, female in no acute distress. HEENT: Normocephalic and atraumatic. Conjunctivae are normal. No scleral icterus. Neck supple.  Cardiovascular: Normal rate, regular rhythm.  Pulmonary/chest: Effort normal and breath sounds normal. No wheezing, rales or rhonchi. Abdominal: Soft, nondistended, nontender.  There are no masses palpable. No hepatomegaly. Extremities: no edema Lymphadenopathy: No cervical adenopathy noted. Neurological: Alert and oriented to person place and time. Skin: Skin is warm and dry. No rashes noted. Psychiatric: Normal mood and affect. Behavior is normal.   ASSESSMENT AND PLAN: 77 year old female here to establish care with our practice, here for new patient assessment for the following issues:  History of colon polyps / history of atrial fibrillation / anticoagulated - as above, history of large high risk adenoma removed surgically in 2002, she has not had surveillance colonoscopy since 2005. Fortunately she is asymptomatic at this time without anemia despite being on Xarelto. We discussed options in light of her age and comorbidities, in regards to whether or not she wished to have any further exams, and after this conversation offered her a surveillance colonoscopy given her history of high risk polyp and lack of any recent surveillance. She is asymptomatic from a cardiopulmonary perspective, last EF looked okay. We discussed risks and benefits of colonoscopy and anesthesia and she wanted to proceed. We'll reach out to Dr. Alvester Chou of Cardiology to ensure it okay for her to hold her Xarelto for 2 days prior to the procedure. I instructed her it okay for her to take an aspirin while she holds the Xarelto to minimize risk of stroke.  Her last colonoscopy was done with Dr. Lyndel Safe and she is currently scheduled to have her follow-up surveillance with him a few weeks. Further recommendations pending the results of that exam.  Chester Cellar, MD [facility]  CC: [name], MD

## 2017-10-28 NOTE — Telephone Encounter (Signed)
Will route to [facility] first for recommendations regarding holding anticoagulation.  Patient will then need a phone call to assess for any clinical change since last visit. Richardson Dopp, PA-C    10/28/2017 2:44 PM

## 2017-10-29 ENCOUNTER — Encounter: Payer: Self-pay | Admitting: Gastroenterology

## 2017-10-29 NOTE — Telephone Encounter (Signed)
According to the patient she is still taking Xarelto. She was in the office yesterday to be instructed for her colonoscopy which is scheduled for next Wednesday, 11-05-17.  We need to know this week if she can hold it for 2 days prior to procedure on 8-14;  Sunday would be her last day to take prior to procedure if Dr. Alvester Chou will give her clearance to hold. Thank you for your prompt reply.

## 2017-10-29 NOTE — Telephone Encounter (Signed)
Patient calling back regarding this. She is wanting to know when she needs to stop taking bt.

## 2017-10-31 MED ORDER — RIVAROXABAN 15 MG PO TABS: 15.0000 mg | ORAL_TABLET | Freq: Every day | ORAL | 5 refills | Status: DC

## 2017-10-31 NOTE — Telephone Encounter (Signed)
Pt has been notified and aware to hold her Xarelto prior to her procedure.

## 2017-10-31 NOTE — Telephone Encounter (Signed)
Records requested from recent hospitalization.

## 2017-10-31 NOTE — Telephone Encounter (Signed)
Spoke with pt she is aware of dose change on her Xarelto. I have notified pt that the new prescription has been sent to her pharmacy. Pt verbalized understanding

## 2017-10-31 NOTE — Telephone Encounter (Signed)
Patient with diagnosis of Afib (see note below) on Xarelto for anticoagulation.    Procedure: colonoscopy Date of procedure: 11/05/17  CHADS2-VASc score of  3 (CHF, HTN, AGE, DM2, stroke/tia x 2, CAD, AGE, female)  CrCl 71ml/min - also of note based on this crcl should should be taking Xarelto 15mg  daily.   Per office protocol, patient can hold Xarelto for 2 days prior to procedure.

## 2017-10-31 NOTE — Telephone Encounter (Signed)
Per Dr. Kennon Holter note in 02/2017 the patient has not been having any recurrences of afib since an episode during hospitalization for CAP and actually it was PAC's and MAT but no PAF. He discontinued her Xarelto at that time however, the patient continued to take it. She does not recall it being stopped.  I called her to inform her that it can be stopped and she reports that she was hospitalized earlier this year in [place] for PNA and had an episode of fast heart beat that may have been afib. She is currently having no palpitations, chest discomfort or dyspnea. She is working in her garden without problems.  I will have her continue Xarelto and discuss at next visit with Dr. Gwenlyn Found.   I will send to our preop pharmacist for recommendations on holding Xarelto for her colonoscopy.  Daune Perch, AGNP-C [facility] 10/31/2017  10:46 AM Pager: [phone]

## 2017-11-05 ENCOUNTER — Ambulatory Visit (AMBULATORY_SURGERY_CENTER): Payer: Medicare HMO | Admitting: Gastroenterology

## 2017-11-05 ENCOUNTER — Encounter: Payer: Self-pay | Admitting: Gastroenterology

## 2017-11-05 VITALS — BP 112/54 | HR 38 | Temp 97.7°F | Resp 11 | Ht 59.0 in | Wt 105.0 lb

## 2017-11-05 DIAGNOSIS — Z86010 Personal history of colonic polyps: Secondary | ICD-10-CM | POA: Diagnosis present

## 2017-11-05 DIAGNOSIS — D123 Benign neoplasm of transverse colon: Secondary | ICD-10-CM | POA: Diagnosis not present

## 2017-11-05 MED ORDER — SODIUM CHLORIDE 0.9 % IV SOLN: 500.0000 mL | Freq: Once | INTRAVENOUS | Status: DC

## 2017-11-05 NOTE — Progress Notes (Signed)
Late entry for 1020  Alert and oriented x 3, pleased with MAC, report to RN

## 2017-11-05 NOTE — Patient Instructions (Signed)
  Thank you for allowing [place] to care for you today!  Await pathology results by mail, 2 weeks.  Resume Xarelto at prior dose tomorrow ( Thursday 11/06/17)     YOU HAD AN ENDOSCOPIC PROCEDURE TODAY AT [facility]:   Refer to the procedure report that was given to you for any specific questions about what was found during the examination.  If the procedure report does not answer your questions, please call your gastroenterologist to clarify.  If you requested that your care partner not be given the details of your procedure findings, then the procedure report has been included in a sealed envelope for you to review at your convenience later.  YOU SHOULD EXPECT: Some feelings of bloating in the abdomen. Passage of more gas than usual.  Walking can help get rid of the air that was put into your GI tract during the procedure and reduce the bloating. If you had a lower endoscopy (such as a colonoscopy or flexible sigmoidoscopy) you may notice spotting of blood in your stool or on the toilet paper. If you underwent a bowel prep for your procedure, you may not have a normal bowel movement for a few days.  Please Note:  You might notice some irritation and congestion in your nose or some drainage.  This is from the oxygen used during your procedure.  There is no need for concern and it should clear up in a day or so.  SYMPTOMS TO REPORT IMMEDIATELY:   Following lower endoscopy (colonoscopy or flexible sigmoidoscopy):  Excessive amounts of blood in the stool  Significant tenderness or worsening of abdominal pains  Swelling of the abdomen that is new, acute  Fever of 100F or higher    For urgent or emergent issues, a gastroenterologist can be reached at any hour by calling [phone].   DIET:  We do recommend a small meal at first, but then you may proceed to your regular diet.  Drink plenty of fluids but you should avoid alcoholic beverages for 24 hours.  ACTIVITY:  You  should plan to take it easy for the rest of today and you should NOT DRIVE or use heavy machinery until tomorrow (because of the sedation medicines used during the test).    FOLLOW UP: Our staff will call the number listed on your records the next business day following your procedure to check on you and address any questions or concerns that you may have regarding the information given to you following your procedure. If we do not reach you, we will leave a message.  However, if you are feeling well and you are not experiencing any problems, there is no need to return our call.  We will assume that you have returned to your regular daily activities without incident.  If any biopsies were taken you will be contacted by phone or by letter within the next 1-3 weeks.  Please call us at [phone] if you have not heard about the biopsies in 3 weeks.    SIGNATURES/CONFIDENTIALITY: You and/or your care partner have signed paperwork which will be entered into your electronic medical record.  These signatures attest to the fact that that the information above on your After Visit Summary has been reviewed and is understood.  Full responsibility of the confidentiality of this discharge information lies with you and/or your care-partner.

## 2017-11-05 NOTE — Op Note (Signed)
[facility] Patient Name: Tami Bell Procedure Date: 11/05/2017 9:37 AM MRN: [id number] Endoscopist: Jackquline Denmark , MD Age: 77 Referring MD:  Date of Birth: 06-18-1940 Gender: Female Account #: [id number] Procedure:                Colonoscopy Indications:              High risk colon cancer surveillance: Personal                            history of colonic polyps. 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 The patient's tolerance of                            previous anesthesia was also reviewed. The risks                            and benefits of the procedure and the sedation                            options and risks were discussed with the patient.                            All questions were answered, and informed consent                            was obtained. Prior Anticoagulants: The patient has                            taken Xarelto (rivaroxaban), last dose was 2 days                            prior to procedure. ASA Grade Assessment: II - A                            patient with mild systemic disease. After reviewing  the risks and benefits, the patient was deemed in                            satisfactory condition to undergo the procedure.                           After obtaining informed consent, the colonoscope                            was passed under direct vision. Throughout the                            procedure, the patient's blood pressure, pulse, and                            oxygen saturations were monitored continuously. The                           Model PCF-H190DL [id number]) scope was introduced                            through the anus and advanced to the the                            ileocolonic anastomosis. The colonoscopy was                            performed without difficulty. The patient tolerated                            the procedure well. The quality of the bowel                            preparation was excellent. Scope In: 10:04:59 AM Scope Out: 10:15:01 AM Scope Withdrawal Time: 0 hours 5 minutes 35 seconds  Total Procedure Duration: 0 hours 10 minutes 2 seconds  Findings:                 A 8 mm polyp was found in the proximal transverse                            colon. The polyp was sessile. The polyp was removed                            with a hot snare. Resection and retrieval were                            complete. Estimated blood loss: none.                           Multiple medium-mouthed diverticula were found in                            the sigmoid colon and descending colon.  Non-bleeding internal hemorrhoids were found during                            retroflexion. The hemorrhoids were small. Complications:            No immediate complications. Estimated Blood Loss:     Estimated blood loss: none. Impression:               - One 8 mm polyp in the proximal transverse colon,                            removed with a hot snare. Resected and retrieved.                           - Diverticulosis in the sigmoid colon and in the                            descending colon.                           - Non-bleeding internal hemorrhoids. Recommendation:           - Patient has a contact number available for                            emergencies. The signs and symptoms of potential                            delayed complications were discussed with the                            patient. Return to normal activities tomorrow.                             Written discharge instructions were provided to the                            patient.                           - Resume previous diet.                           - Continue present medications.                           - Await pathology results.                           - Resume Xarelto (rivaroxaban) at prior dose in 1                            day. Jackquline Denmark, MD 11/05/2017 10:20:35 AM This report has been signed electronically.

## 2017-11-05 NOTE — Progress Notes (Signed)
Called to room to assist during endoscopic procedure.  Patient ID and intended procedure confirmed with present staff. Received instructions for my participation in the procedure from the performing physician.  

## 2017-11-05 NOTE — Progress Notes (Signed)
Pt's states no medical or surgical changes since previsit or office visit. 

## 2017-11-06 ENCOUNTER — Telehealth: Payer: Self-pay

## 2017-11-06 NOTE — Telephone Encounter (Signed)
  Follow up Call-  Call back number 11/05/2017  Post procedure Call Back phone  # [phone]  Permission to leave phone message Yes  Some recent data might be hidden     Patient questions:  Do you have a fever, pain , or abdominal swelling? No. Pain Score  0 *  Have you tolerated food without any problems? Yes.    Have you been able to return to your normal activities? Yes.    Do you have any questions about your discharge instructions: Diet   No. Medications  No. Follow up visit  No.  Do you have questions or concerns about your Care? No.  Actions: * If pain score is 4 or above: No action needed, pain <4.

## 2017-11-13 ENCOUNTER — Encounter: Payer: Self-pay | Admitting: Gastroenterology

## 2018-06-11 ENCOUNTER — Other Ambulatory Visit: Payer: Self-pay | Admitting: Cardiology

## 2018-06-11 NOTE — Telephone Encounter (Signed)
Scr = 0.84 (Care Everywhere) on 10/20/2017  Last seen by cardiologist Dec/2018

## 2018-08-05 ENCOUNTER — Other Ambulatory Visit: Payer: Self-pay | Admitting: Cardiovascular Disease

## 2018-08-27 ENCOUNTER — Telehealth: Payer: Self-pay | Admitting: Cardiovascular Disease

## 2018-08-27 NOTE — Telephone Encounter (Signed)
call home phone/ verbal consent/ my chart/ pre reg completed

## 2018-09-01 ENCOUNTER — Telehealth (INDEPENDENT_AMBULATORY_CARE_PROVIDER_SITE_OTHER): Payer: Medicare HMO | Admitting: Cardiovascular Disease

## 2018-09-01 ENCOUNTER — Encounter: Payer: Self-pay | Admitting: Cardiovascular Disease

## 2018-09-01 ENCOUNTER — Telehealth: Payer: Self-pay

## 2018-09-01 DIAGNOSIS — I480 Paroxysmal atrial fibrillation: Secondary | ICD-10-CM | POA: Diagnosis not present

## 2018-09-01 DIAGNOSIS — E785 Hyperlipidemia, unspecified: Secondary | ICD-10-CM | POA: Diagnosis not present

## 2018-09-01 MED ORDER — ASPIRIN EC 81 MG PO TBEC: 81.0000 mg | DELAYED_RELEASE_TABLET | Freq: Every day | ORAL | 3 refills | Status: DC

## 2018-09-01 NOTE — Progress Notes (Signed)
Virtual Visit via Telephone Note   This visit type was conducted due to national recommendations for restrictions regarding the COVID-19 Pandemic (e.g. social distancing) in an effort to limit this patient's exposure and mitigate transmission in our community.  Due to her co-morbid illnesses, this patient is at least at moderate risk for complications without adequate follow up.  This format is felt to be most appropriate for this patient at this time.  The patient did not have access to video technology/had technical difficulties with video requiring transitioning to audio format only (telephone).  All issues noted in this document were discussed and addressed.  No physical exam could be performed with this format.  Please refer to the patient's chart for her  consent to telehealth for [facility].   Date:  09/01/2018   ID:  Tami Bell, DOB 1940-12-02, MRN [id number]  Patient Location: Home Provider Location: Home  PCP:  [name], MD  Cardiologist:   Electrophysiologist:  None   Evaluation Performed:  Follow-Up Visit  Chief Complaint: PAF  History of Present Illness:    Tami Bell is a 78 y.o.  mildly overweight married Caucasian female who states husband Tami Bell is also a patient of mine and who accompanies her today. I last saw her  03/11/2017. She is the mother of 27, grandmother and 5 grandchildren. She was referred by [name] in [facility] for evaluation treatment of new onset symptomatic atrial fibrillation. Her only cardiovascular risk factor is treated hyperlipidemia. She does have an older brother who died of a myocardial infarction and had bypass surgery. She has never had a heart attack or stroke. She denies chest pain but has had fairly recent increase in dyspnea on exertion. There is no history of bleeding problems. She noticed increased heart rate and fatigue 2-3 weeks prior to seeing me late last yearand saw her primary care physician who documented  new onset A. Fib with RVR. He began low-dose beta blocker and oral anticoagulation resulting in improvement in her symptoms. Routine lab work was performed and apparently her thyroid function tests were normal.she was hospitalized in December with A. Fib with RVR and spontaneously converted to sinus rhythm. She was complaining of weakness and increasing dyspnea on exertion. Recent Myoview stress test showed no ischemia but had decreased ejection fraction and 2-D echo revealed an EF of 45-50% with mild global hypokinesia. I performed outpatient cardiac catheterization on her 05/05/14 via the right radial approach revealing normal coronary arteries and low normal LV function. Since that time her dyspnea has markedly improved. She wore a 1 month event monitor that showed no evidence of PAF and as a result I decided to stop her Xarelto . Since I saw her 6 months ago she was hospitalized for daily quite pneumonia was noted to have PACs and MAT but no PAF was demonstrated.  Since I saw her a year and a half ago she is remained stable.  She does not think that she is had any recurrent PAF.  She denies chest pain or shortness of breath.  She is sheltering in place and socially distancing with her husband Tami Bell.   The patient does not have symptoms concerning for COVID-19 infection (fever, chills, cough, or new shortness of breath).    Past Medical History:  Diagnosis Date  . Cataract   . Dyspnea on exertion   . Glaucoma   . Hyperlipidemia   . Osteoporosis   . Paroxysmal atrial fibrillation (HCC)  Past Surgical History:  Procedure Laterality Date  . CARDIOVERSION N/A 03/11/2014   Procedure: CARDIOVERSION;  Surgeon: Sanda Klein, MD;  Location: [place];  Service: Cardiovascular;  Laterality: N/A;  . LEFT HEART CATHETERIZATION WITH CORONARY ANGIOGRAM N/A 05/05/2014   Procedure: LEFT HEART CATHETERIZATION WITH CORONARY ANGIOGRAM;  Surgeon: Lorretta Harp, MD;  [facility]: [facility] C[facility] LAB;  Service:  Cardiovascular;  Laterality: N/A;     No outpatient medications have been marked as taking for the 09/01/18 encounter (Appointment) with Lorretta Harp, MD.     Allergies:   Patient has no known allergies.   Social History   Tobacco Use  . Smoking status: Never Smoker  . Smokeless tobacco: Never Used  Substance Use Topics  . Alcohol use: No    Alcohol/week: 0.0 standard drinks  . Drug use: No     Family Hx: The patient's family history includes Heart attack in her brother; Heart disease in her brother. There is no history of Colon cancer, Esophageal cancer, Rectal cancer, or Stomach cancer.  ROS:   Please see the history of present illness.     All other systems reviewed and are negative.   Prior CV studies:   The following studies were reviewed today:  None  Labs/Other Tests and Data Reviewed:    EKG:  No ECG reviewed.  Recent Labs: No results found for requested labs within last 8760 hours.   Recent Lipid Panel No results found for: CHOL, TRIG, HDL, CHOLHDL, LDLCALC, LDLDIRECT  Wt Readings from Last 3 Encounters:  11/05/17 105 lb (47.6 kg)  10/28/17 105 lb (47.6 kg)  10/28/17 105 lb (47.6 kg)     Objective:    Vital Signs:  There were no vitals taken for this visit.   VITAL SIGNS:  reviewed a complete physical exam was not performed today since this was a virtual telemedicine phone visit  ASSESSMENT & PLAN:    1. Hyperlipidemia- history of hyperlipidemia on simvastatin followed by her PCP. 2. PAF- history of PAF in the past with subsequent conversion to sinus rhythm spontaneously and a 30-day event monitor that showed no evidence of recurrence.  I did decide to discontinue Xarelto at that time but for some patients he is still on it.  I am going to discontinue her Xarelto 3. Normal coronary arteries- history of normal coronary arteries by cath 05/05/2014  COVID-19 Education: The signs and symptoms of COVID-19 were discussed with the patient and how to  seek care for testing (follow up with PCP or arrange E-visit).  The importance of social distancing was discussed today.  Time:   Today, I have spent 7 minutes with the patient with telehealth technology discussing the above problems.     Medication Adjustments/Labs and Tests Ordered: Current medicines are reviewed at length with the patient today.  Concerns regarding medicines are outlined above.   Tests Ordered: No orders of the defined types were placed in this encounter.   Medication Changes: No orders of the defined types were placed in this encounter.   Disposition:  Follow up in 1 year(s)  Signed, Quay Burow, MD  09/01/2018 7:20 AM    [facility]

## 2018-09-01 NOTE — Patient Instructions (Addendum)
Medication Instructions:  Your physician has recommended you make the following change in your medication:   STOP TAKING YOUR RIVAROXABAN (XARELTO).  START ASPIRIN 81 MG, ONE TABLET BY MOUTH DAILY.  If you need a refill on your cardiac medications before your next appointment, please call your pharmacy.   Lab work: NONE If you have labs (blood work) drawn today and your tests are completely normal, you will receive your results only by: Marland Kitchen MyChart Message (if you have MyChart) OR . A paper copy in the mail If you have any lab test that is abnormal or we need to change your treatment, we will call you to review the results.  Testing/Procedures: NONE  Follow-Up: At [facility], you and your health needs are our priority.  As part of our continuing mission to provide you with exceptional heart care, we have created designated Provider Care Teams.  These Care Teams include your primary Cardiologist (physician) and Advanced Practice Providers (APPs -  Physician Assistants and Nurse Practitioners) who all work together to provide you with the care you need, when you need it. You will need a follow up appointment in 12 months WITH DR. Gwenlyn Found.  Please call our office 2 months in advance to schedule this appointment.

## 2018-09-01 NOTE — Addendum Note (Signed)
Addended by: Annita Brod on: 09/01/2018 09:20 AM   Modules accepted: Orders

## 2018-09-01 NOTE — Addendum Note (Signed)
Addended by: Annita Brod on: 09/01/2018 08:58 AM   Modules accepted: Orders

## 2018-09-01 NOTE — Telephone Encounter (Signed)
Patient and/or DPR-approved person aware of 6/9 AVS instructions and verbalized understanding. Letter including After Visit Summary and any other necessary documents to be mailed to the patient's address on file.  

## 2019-09-20 ENCOUNTER — Telehealth (INDEPENDENT_AMBULATORY_CARE_PROVIDER_SITE_OTHER): Payer: Medicare HMO | Admitting: Cardiology

## 2019-09-20 ENCOUNTER — Encounter: Payer: Self-pay | Admitting: Cardiology

## 2019-09-20 VITALS — Ht 59.0 in | Wt 105.0 lb

## 2019-09-20 DIAGNOSIS — I480 Paroxysmal atrial fibrillation: Secondary | ICD-10-CM | POA: Diagnosis not present

## 2019-09-20 DIAGNOSIS — J189 Pneumonia, unspecified organism: Secondary | ICD-10-CM | POA: Diagnosis not present

## 2019-09-20 NOTE — Progress Notes (Signed)
Virtual Visit via Telephone Note   This visit type was conducted due to national recommendations for restrictions regarding the COVID-19 Pandemic (e.g. social distancing) in an effort to limit this patient's exposure and mitigate transmission in our community.  Due to her co-morbid illnesses, this patient is at least at moderate risk for complications without adequate follow up.  This format is felt to be most appropriate for this patient at this time.  The patient did not have access to video technology/had technical difficulties with video requiring transitioning to audio format only (telephone).  All issues noted in this document were discussed and addressed.  No physical exam could be performed with this format.  Please refer to the patient's chart for her  consent to telehealth for [facility].   The patient was identified using 2 identifiers.  Date:  09/20/2019   ID:  Tami Bell 04/10/1940, MRN [id number]  Patient Location: Home Provider Location: Home  PCP:  [name], MD  Cardiologist:  Dr Gwenlyn Found Electrophysiologist:  None   Evaluation Performed:  Follow-Up Visit  Chief Complaint:  none  History of Present Illness:    Tami Bell is a 79 y.o. female with a history of PAF in 2015.  Echocardiogram showed an ejection fraction of 45 to 50% then.  She underwent cardioversion to sinus rhythm.  She did have a heart catheterization in 2016 that showed normal coronaries.  30-day monitor in 2017 showed no atrial fibrillation and Dr. Gwenlyn Found decided to stop the patient's Xarelto after a monitor showed no recurrent PAF.    She was hospitalized at [facility] in in 2018 with CAP.  She had ectopy and MAT but no PAF. Her beta blocker was changed to low dose Diltiazem then which she continues to take.  Xarelto was also resumed then but stopped again June 2020 by Dr Gwenlyn Found.  She was contacted today for routine follow-up.  Since we saw her last she is done well, she denies any palpitations or  tachycardia.  In reviewing her medications she says she is taking Mucinex D, I suggested she just take regular Mucinex.  The patient does not have symptoms concerning for COVID-19 infection (fever, chills, cough, or new shortness of breath).    Past Medical History:  Diagnosis Date  . Anticoagulated 12/19/2016  . CAP (community acquired pneumonia) 12/19/2016  . Cataract   . Dyslipidemia 02/09/2014   hyperlipidemia   . Dyspnea on exertion   . Glaucoma   . Hyperlipidemia   . Osteoporosis   . Paroxysmal atrial fibrillation [facility])    Past Surgical History:  Procedure Laterality Date  . CARDIOVERSION N/A 03/11/2014   Procedure: CARDIOVERSION;  Surgeon: Sanda Klein, MD;  Location: [place];  Service: Cardiovascular;  Laterality: N/A;  . LEFT HEART CATHETERIZATION WITH CORONARY ANGIOGRAM N/A 05/05/2014   Procedure: LEFT HEART CATHETERIZATION WITH CORONARY ANGIOGRAM;  Surgeon: Lorretta Harp, MD;  Location: [facility] CATH LAB;  Service: Cardiovascular;  Laterality: N/A;     Current Meds  Medication Sig  . ALPRAZolam (XANAX) 0.25 MG tablet Take 0.5 tablets by mouth daily as needed for anxiety.   Marland Kitchen aspirin EC 81 MG tablet Take 1 tablet (81 mg total) by mouth daily.  . Calcium Carbonate-Vitamin D (CALCIUM + D PO) Take 1 tablet by mouth daily.  . citalopram (CELEXA) 10 MG tablet Take 10 mg by mouth daily.  Marland Kitchen diltiazem (CARDIZEM) 30 MG tablet Take 1 tablet (30 mg total) by mouth 2 (two) times daily.  Marland Kitchen  dorzolamide (TRUSOPT) 2 % ophthalmic solution Place 1 drop into both eyes 2 (two) times daily.   Marland Kitchen latanoprost (XALATAN) 0.005 % ophthalmic solution Place 1 drop into both eyes at bedtime.  . Multiple Vitamins-Minerals (MULTIVITAMIN WITH MINERALS) tablet Take 1 tablet by mouth daily.  . simvastatin (ZOCOR) 20 MG tablet Take 20 mg by mouth daily.  . timolol (TIMOPTIC) 0.25 % ophthalmic solution Place 1 drop into both eyes 2 (two) times daily.      Allergies:   Patient has no known allergies.    Social History   Tobacco Use  . Smoking status: Never Smoker  . Smokeless tobacco: Never Used  Substance Use Topics  . Alcohol use: No    Alcohol/week: 0.0 standard drinks  . Drug use: No     Family Hx: The patient's family history includes Heart attack in her brother; Heart disease in her brother. There is no history of Colon cancer, Esophageal cancer, Rectal cancer, or Stomach cancer.  ROS:   Please see the history of present illness.    All other systems reviewed and are negative.   Prior CV studies:   The following studies were reviewed today: Event Monitor 2017-   Labs/Other Tests and Data Reviewed:    EKG:  An ECG dated 12/19/2016 was personally reviewed today and demonstrated:  NSR  Recent Labs: No results found for requested labs within last 8760 hours.   Recent Lipid Panel No results found for: CHOL, TRIG, HDL, CHOLHDL, LDLCALC, LDLDIRECT  Wt Readings from Last 3 Encounters:  09/20/19 105 lb (47.6 kg)  09/01/18 101 lb (45.8 kg)  11/05/17 105 lb (47.6 kg)     Objective:    Vital Signs:  Ht 4\' 11"  (1.499 m)   Wt 105 lb (47.6 kg)   BMI 21.21 kg/m    VITAL SIGNS:  reviewed  ASSESSMENT & PLAN:    Paroxysmal atrial fibrillation OP DCCV in 2015. Suspected recurrent PAF during hospitalization at [facility] in 2018- though clear cut AF was not seen, frequent PACs and "MAT".  Beta blocker changed to Diltiazem then. No recurrent PAF by her history.   Anticoagulated Xarelto stopped June 2020, no evidence of recurrent PAF.   Normal coronaries and low normal LVF- Cath 2016  Plan: Avoid OTC decongestants with pseudoephedrine.  F/U Dr Gwenlyn Found in one year.    COVID-19 Education: The signs and symptoms of COVID-19 were discussed with the patient and how to seek care for testing (follow up with PCP or arrange E-visit).  The importance of social distancing was discussed today.  Time:   Today, I have spent 10 minutes with the patient with telehealth  technology discussing the above problems.     Medication Adjustments/Labs and Tests Ordered: Current medicines are reviewed at length with the patient today.  Concerns regarding medicines are outlined above.   Tests Ordered: No orders of the defined types were placed in this encounter.   Medication Changes: No orders of the defined types were placed in this encounter.   Follow Up:  In Person Dr Gwenlyn Found in one year  Signed, Kerin Ransom, [place]hal Coria  09/20/2019 10:37 AM    [place]

## 2019-09-20 NOTE — Patient Instructions (Signed)
Medication Instructions:  Your physician recommends that you continue on your current medications as directed. Please refer to the Current Medication list given to you today.  *If you need a refill on your cardiac medications before your next appointment, please call your pharmacy*   Follow-Up: At [facility], you and your health needs are our priority.  As part of our continuing mission to provide you with exceptional heart care, we have created designated Provider Care Teams.  These Care Teams include your primary Cardiologist (physician) and Advanced Practice Providers (APPs -  Physician Assistants and Nurse Practitioners) who all work together to provide you with the care you need, when you need it.  We recommend signing up for the patient portal called "MyChart".  Sign up information is provided on this After Visit Summary.  MyChart is used to connect with patients for Virtual Visits (Telemedicine).  Patients are able to view lab/test results, encounter notes, upcoming appointments, etc.  Non-urgent messages can be sent to your provider as well.   To learn more about what you can do with MyChart, go to [email or web address].    Your next appointment:   12 month(s)  The format for your next appointment:   In Person  Provider:   You may see Jonathan Berry, MD or one of the following Advanced Practice Providers on your designated Care Team:    Luke Kilroy, PA-C  Callie Goodrich, PA-C  Jesse Cleaver, FNP    Other Instructions Please call our office 2 months in advance to schedule your follow-up appointment.  

## 2020-09-05 ENCOUNTER — Ambulatory Visit (INDEPENDENT_AMBULATORY_CARE_PROVIDER_SITE_OTHER): Payer: Medicare HMO | Admitting: Physician Assistant

## 2020-09-05 ENCOUNTER — Other Ambulatory Visit: Payer: Self-pay

## 2020-09-05 ENCOUNTER — Encounter: Payer: Self-pay | Admitting: Physician Assistant

## 2020-09-05 ENCOUNTER — Other Ambulatory Visit: Payer: Self-pay | Admitting: Physician Assistant

## 2020-09-05 VITALS — BP 154/70 | HR 73 | Ht 59.0 in | Wt 98.4 lb

## 2020-09-05 DIAGNOSIS — E785 Hyperlipidemia, unspecified: Secondary | ICD-10-CM | POA: Diagnosis not present

## 2020-09-05 DIAGNOSIS — I480 Paroxysmal atrial fibrillation: Secondary | ICD-10-CM

## 2020-09-05 DIAGNOSIS — R5383 Other fatigue: Secondary | ICD-10-CM

## 2020-09-05 DIAGNOSIS — R0602 Shortness of breath: Secondary | ICD-10-CM

## 2020-09-05 NOTE — Progress Notes (Signed)
Cardiology Office Note:    Date:  09/07/2020   ID:  Tami Bell, DOB Oct 29, 1940, MRN [id number]  PCP:  [name], MD   [facility] Providers Cardiologist:  Quay Burow, MD {    Referring MD: [name], MD   Chief Complaint  Patient presents with   Follow-up    Seen for Dr. Gwenlyn Found     History of Present Illness:    Tami Bell is a 80 y.o. female with a hx of bronchiectasis, aortic atherosclerosis, PAF and hyperlipidemia.  She was diagnosed with PAF in 2015.  Echocardiogram showed EF 45 to 50% at that time.  She underwent cardioversion to sinus rhythm.  Cardiac catheterization in 2016 showed normal coronary arteries.  A 30-day heart monitor in 2017 showed no atrial fibrillation and her Xarelto was discontinued after that.  Unfortunately she was hospitalized in 2018 with community-acquired pneumonia.  Telemetry showed atrial ectopy and MAT but no PAF.  Beta-blocker was changed to low-dose calcium channel blocker.  Xarelto was also resumed however stopped again in June 2020 by Dr. Alvester Chou.  She was last seen by Kerin Ransom, PA-C on 09/12/2019 at which time she was doing well and holding sinus rhythm.  Patient presents today for follow-up along with her daughter.  She has been having increasing fatigue in the past few months.  Unfortunately her husband passed away in 08/04/2022 of this year.  She denies any chest discomfort but noticed increasing shortness of breath with exertion.  Recent blood work obtained by PCP shows normal red blood cell count in June and a normal TSH in March 2022.  I do not see any basic metabolic panel.  Given her increasing dyspnea on exertion, I also recommend a repeat echocardiogram as well.  She has no problem falling asleep but sometimes she wake up in the middle of the night and cannot get back to sleep.  If above lab work and echocardiogram are normal, I would not recommend any further work-up.  Her fatigue may be due to combination of things.  She was  just diagnosed with urinary tract infection about a week ago, and she finished a course of antibiotic.  Since finishing antibiotic, she has not noticed any improvement.  Of course with loss of family member, depression and lack of sleep can also contribute to fatigue as well.  If her work-up is normal, I would recommend to focus on sleep hygiene and increase exercise level.  Past Medical History:  Diagnosis Date   Anticoagulated 12/19/2016   CAP (community acquired pneumonia) 12/19/2016   Cataract    Dyslipidemia 02/09/2014   hyperlipidemia    Dyspnea on exertion    Glaucoma    Hyperlipidemia    Osteoporosis    Paroxysmal atrial fibrillation [facility])     Past Surgical History:  Procedure Laterality Date   CARDIOVERSION N/A 03/11/2014   Procedure: CARDIOVERSION;  Surgeon: Sanda Klein, MD;  Location: [place];  Service: Cardiovascular;  Laterality: N/A;   LEFT HEART CATHETERIZATION WITH CORONARY ANGIOGRAM N/A 05/05/2014   Procedure: LEFT HEART CATHETERIZATION WITH CORONARY ANGIOGRAM;  Surgeon: Lorretta Harp, MD;  Location: [facility] CATH LAB;  Service: Cardiovascular;  Laterality: N/A;    Current Medications: Current Meds  Medication Sig   ALPRAZolam (XANAX) 0.25 MG tablet Take 0.5 tablets by mouth daily as needed for anxiety.    aspirin EC 81 MG tablet Take 1 tablet (81 mg total) by mouth daily.   Calcium Carbonate-Vitamin D (CALCIUM + D  PO) Take 1 tablet by mouth daily.   diltiazem (CARDIZEM) 30 MG tablet Take 1 tablet (30 mg total) by mouth 2 (two) times daily.   dorzolamide (TRUSOPT) 2 % ophthalmic solution Place 1 drop into both eyes 2 (two) times daily.    guaiFENesin (MUCINEX) 600 MG 12 hr tablet Take 600 mg by mouth 2 (two) times daily as needed.   latanoprost (XALATAN) 0.005 % ophthalmic solution Place 1 drop into both eyes at bedtime.   Latanoprostene Bunod (VYZULTA) 0.024 % SOLN Place 1 drop into the right eye daily.   Multiple Vitamins-Minerals (MULTIVITAMIN WITH MINERALS)  tablet Take 1 tablet by mouth daily.   rosuvastatin (CRESTOR) 10 MG tablet Take 1 tablet by mouth daily.   sertraline (ZOLOFT) 50 MG tablet Take 1 tablet by mouth daily.   timolol (TIMOPTIC) 0.25 % ophthalmic solution Place 1 drop into both eyes 2 (two) times daily.    [DISCONTINUED] citalopram (CELEXA) 10 MG tablet Take 10 mg by mouth daily.   [DISCONTINUED] dorzolamide-timolol (COSOPT) 22.3-6.8 MG/ML ophthalmic solution    [DISCONTINUED] latanoprost (XALATAN) 0.005 % ophthalmic solution Place 1 drop into the left eye daily.     Allergies:   Patient has no known allergies.   Social History   Socioeconomic History   Marital status: Married    Spouse name: Not on file   Number of children: Not on file   Years of education: Not on file   Highest education level: Not on file  Occupational History   Not on file  Tobacco Use   Smoking status: Never   Smokeless tobacco: Never  Substance and Sexual Activity   Alcohol use: No    Alcohol/week: 0.0 standard drinks   Drug use: No   Sexual activity: Not on file  Other Topics Concern   Not on file  Social History Narrative   Not on file   Social Determinants of Health   Financial Resource Strain: Not on file  Food Insecurity: Not on file  Transportation Needs: Not on file  Physical Activity: Not on file  Stress: Not on file  Social Connections: Not on file     Family History: The patient's family history includes Heart attack in her brother; Heart disease in her brother. There is no history of Colon cancer, Esophageal cancer, Rectal cancer, or Stomach cancer.  ROS:   Please see the history of present illness.     All other systems reviewed and are negative.  EKGs/Labs/Other Studies Reviewed:    The following studies were reviewed today:  N/A  EKG:  EKG is ordered today.  The ekg ordered today demonstrates normal sinus rhythm with PVCs, no significant ST-T wave changes.  Incomplete right bundle branch block.  Recent  Labs: 09/05/2020: BUN 12; Creatinine, Ser 0.53; Potassium 4.2; Sodium 143  Recent Lipid Panel No results found for: CHOL, TRIG, HDL, CHOLHDL, VLDL, LDLCALC, LDLDIRECT   Risk Assessment/Calculations:    CHA2DS2-VASc Score = 3  This indicates a 3.2% annual risk of stroke. The patient's score is based upon: CHF History: No HTN History: No Diabetes History: No Stroke History: No Vascular Disease History: No Age Score: 2 Gender Score: 1     Physical Exam:    VS:  BP (!) 154/70   Pulse 73   Ht 4\' 11"  (1.499 m)   Wt 98 lb 6.4 oz (44.6 kg)   SpO2 97%   BMI 19.87 kg/m     Wt Readings from Last 3 Encounters:  09/05/20 98  lb 6.4 oz (44.6 kg)  09/20/19 105 lb (47.6 kg)  09/01/18 101 lb (45.8 kg)     GEN:  Well nourished, well developed in no acute distress HEENT: Normal NECK: No JVD; No carotid bruits LYMPHATICS: No lymphadenopathy CARDIAC: RRR, no murmurs, rubs, gallops RESPIRATORY:  Clear to auscultation without rales, wheezing or rhonchi  ABDOMEN: Soft, non-tender, non-distended MUSCULOSKELETAL:  No edema; No deformity  SKIN: Warm and dry NEUROLOGIC:  Alert and oriented x 3 PSYCHIATRIC:  Normal affect   ASSESSMENT:    1. Fatigue, unspecified type   2. SOB (shortness of breath)   3. PAF (paroxysmal atrial fibrillation) ([place])   4. Hyperlipidemia LDL goal <100    PLAN:    In order of problems listed above:  Fatigue: This is a recent symptom.  I recommended basic metabolic panel.  Her recent CBC was normal.  A TSH was also normal.   Shortness of breath: Obtain echocardiogram  PAF: Maintaining sinus rhythm.  No obvious recurrence.  Continue on low-dose diltiazem  Hyperlipidemia: On Crestor        Medication Adjustments/Labs and Tests Ordered: Current medicines are reviewed at length with the patient today.  Concerns regarding medicines are outlined above.  Orders Placed This Encounter  Procedures   Basic metabolic panel   EKG 09-TOIZ   ECHOCARDIOGRAM  COMPLETE   No orders of the defined types were placed in this encounter.   Patient Instructions  Medication Instructions:  Your physician recommends that you continue on your current medications as directed. Please refer to the Current Medication list given to you today.  *If you need a refill on your cardiac medications before your next appointment, please call your pharmacy*   Lab Work: Your physician recommends that you return for lab work TODAY:  BMET  If you have labs (blood work) drawn today and your tests are completely normal, you will receive your results only by: [place] (if you have MyChart) OR A paper copy in the mail If you have any lab test that is abnormal or we need to change your treatment, we will call you to review the results.   Testing/Procedures: Your physician has requested that you have an echocardiogram. Echocardiography is a painless test that uses sound waves to create images of your heart. It provides your doctor with information about the size and shape of your heart and how well your heart's chambers and valves are working. This procedure takes approximately one hour. There are no restrictions for this procedure.  PLEASE SCHEDULE FOR 4-6 WEEKS   Follow-Up: At [facility], you and your health needs are our priority.  As part of our continuing mission to provide you with exceptional heart care, we have created designated Provider Care Teams.  These Care Teams include your primary Cardiologist (physician) and Advanced Practice Providers (APPs -  Physician Assistants and Nurse Practitioners) who all work together to provide you with the care you need, when you need it.   Your next appointment:   12 month(s)  The format for your next appointment:   In Person  Provider:   Quay Burow, MD  Other Instructions    Signed, Almyra Deforest, [place]  09/07/2020 11:41 PM    [place]

## 2020-09-05 NOTE — Patient Instructions (Signed)
Medication Instructions:  Your physician recommends that you continue on your current medications as directed. Please refer to the Current Medication list given to you today.  *If you need a refill on your cardiac medications before your next appointment, please call your pharmacy*   Lab Work: Your physician recommends that you return for lab work TODAY:  BMET  If you have labs (blood work) drawn today and your tests are completely normal, you will receive your results only by: [place] (if you have MyChart) OR A paper copy in the mail If you have any lab test that is abnormal or we need to change your treatment, we will call you to review the results.   Testing/Procedures: Your physician has requested that you have an echocardiogram. Echocardiography is a painless test that uses sound waves to create images of your heart. It provides your doctor with information about the size and shape of your heart and how well your heart's chambers and valves are working. This procedure takes approximately one hour. There are no restrictions for this procedure.  PLEASE SCHEDULE FOR 4-6 WEEKS   Follow-Up: At [facility], you and your health needs are our priority.  As part of our continuing mission to provide you with exceptional heart care, we have created designated Provider Care Teams.  These Care Teams include your primary Cardiologist (physician) and Advanced Practice Providers (APPs -  Physician Assistants and Nurse Practitioners) who all work together to provide you with the care you need, when you need it.   Your next appointment:   12 month(s)  The format for your next appointment:   In Person  Provider:   Quay Burow, MD  Other Instructions

## 2020-09-06 LAB — BASIC METABOLIC PANEL
BUN/Creatinine Ratio: 23 (ref 12–28)
BUN: 12 mg/dL (ref 8–27)
CO2: 25 mmol/L (ref 20–29)
Calcium: 9.4 mg/dL (ref 8.7–10.3)
Chloride: 105 mmol/L (ref 96–106)
Creatinine, Ser: 0.53 mg/dL — ABNORMAL LOW (ref 0.57–1.00)
Glucose: 84 mg/dL (ref 65–99)
Potassium: 4.2 mmol/L (ref 3.5–5.2)
Sodium: 143 mmol/L (ref 134–144)
eGFR: 94 mL/min/{1.73_m2} (ref >59)

## 2020-09-07 ENCOUNTER — Encounter: Payer: Self-pay | Admitting: Physician Assistant

## 2020-09-11 NOTE — Progress Notes (Signed)
Stable renal function and electrolyte

## 2020-10-18 ENCOUNTER — Other Ambulatory Visit: Payer: Self-pay

## 2020-10-18 ENCOUNTER — Ambulatory Visit (HOSPITAL_COMMUNITY): Payer: Medicare HMO | Attending: Internal Medicine

## 2020-10-18 DIAGNOSIS — R0602 Shortness of breath: Secondary | ICD-10-CM | POA: Insufficient documentation

## 2020-10-18 DIAGNOSIS — R5383 Other fatigue: Secondary | ICD-10-CM | POA: Insufficient documentation

## 2020-10-18 LAB — ECHOCARDIOGRAM COMPLETE
Area-P 1/2: 3.37 cm2
S' Lateral: 3 cm

## 2021-07-31 ENCOUNTER — Other Ambulatory Visit: Payer: Self-pay

## 2021-08-01 ENCOUNTER — Encounter: Payer: Self-pay | Admitting: Hematology and Oncology

## 2021-08-01 ENCOUNTER — Other Ambulatory Visit: Payer: Medicare HMO

## 2021-08-01 ENCOUNTER — Inpatient Hospital Stay: Payer: Medicare HMO | Attending: Hematology and Oncology | Admitting: Hematology and Oncology

## 2021-08-01 ENCOUNTER — Telehealth: Payer: Self-pay | Admitting: Hematology and Oncology

## 2021-08-01 VITALS — BP 168/74 | HR 66 | Temp 98.2°F | Resp 18 | Ht 58.1 in | Wt 96.3 lb

## 2021-08-01 DIAGNOSIS — Z79899 Other long term (current) drug therapy: Secondary | ICD-10-CM

## 2021-08-01 DIAGNOSIS — R531 Weakness: Secondary | ICD-10-CM | POA: Diagnosis not present

## 2021-08-01 DIAGNOSIS — R5383 Other fatigue: Secondary | ICD-10-CM

## 2021-08-01 DIAGNOSIS — Z8249 Family history of ischemic heart disease and other diseases of the circulatory system: Secondary | ICD-10-CM

## 2021-08-01 DIAGNOSIS — D472 Monoclonal gammopathy: Secondary | ICD-10-CM | POA: Diagnosis not present

## 2021-08-01 DIAGNOSIS — Z8551 Personal history of malignant neoplasm of bladder: Secondary | ICD-10-CM | POA: Insufficient documentation

## 2021-08-01 NOTE — Telephone Encounter (Signed)
Per 08/01/21 los next appt scheduled and confirmed with patient ?

## 2021-08-01 NOTE — Progress Notes (Cosign Needed)
?[facility]  ?[address] ?[place],  [place]  [address] ?[phone] ? ?Clinic Day:  08/01/2021 ? ?Referring physician: [name], MD ? ? ?REASON FOR CONSULTATION:  ?Monoclonal protein ? ?HISTORY OF PRESENT ILLNESS:  ?Tami Bell is a 81 y.o. female with a new finding of monoclonal spike, IgG kappa, on serum protein electrophoresis.  This was done to evaluate general concerns of fatigue, general weakness and weight loss.  She is referred in consultation by Mayer Camel, NP for assessment and management.  On April 20th, serum protein electrophoresis revealed a M spike in the far gamma region, which was too small to quantitative, so less than 0.2 g/dL.  Immunofixation revealed the monoclonal spike to be IgG kappa.  Her renal function and calcium were normal.  CBC revealed mild leukopenia, normal hemoglobin with an elevated MCV. Platelets were normal.  The patient had previously low B12 and was not taking a supplement, so has been started on B12 injections.   On May 2nd, free lambda light chains and quantitative immunoglobulins were normal.  The patient states she feels her weight has stabilized, but she remains fatigued.  She denies bone pain.  She denies paresthesias or focal weakness.  She denies any family history of malignancy. ? ?REVIEW OF SYSTEMS:  ?Review of Systems  ?Constitutional:  Positive for appetite change, fatigue and unexpected weight change. Negative for chills and fever.  ?HENT:   Negative for lump/mass, mouth sores and sore throat.   ?Respiratory:  Negative for cough and shortness of breath.   ?Cardiovascular:  Negative for chest pain and leg swelling.  ?Gastrointestinal:  Negative for abdominal pain, constipation, diarrhea, nausea and vomiting.  ?Endocrine: Negative for hot flashes.  ?Genitourinary:  Negative for difficulty urinating, dysuria, frequency and hematuria.   ?Musculoskeletal:  Negative for arthralgias, back pain, myalgias and neck  pain.  ?Skin:  Negative for itching, rash and wound.  ?Neurological:  Negative for dizziness and headaches.  ?Hematological:  Negative for adenopathy. Does not bruise/bleed easily.  ?Psychiatric/Behavioral:  Negative for depression and sleep disturbance. The patient is not nervous/anxious.    ? ?VITALS:  ?Blood pressure (!) 168/74, pulse 66, temperature 98.2 ?F (36.8 ?C), temperature source Oral, resp. rate 18, height 4' 10.1" (1.476 m), weight 96 lb 4.8 oz (43.7 kg), SpO2 97 %.  ?Wt Readings from Last 3 Encounters:  ?08/01/21 96 lb 4.8 oz (43.7 kg)  ?09/05/20 98 lb 6.4 oz (44.6 kg)  ?09/20/19 105 lb (47.6 kg)  ?  ?Body mass index is 20.06 kg/m?. ? ?Performance status (ECOG): 1 - Symptomatic but completely ambulatory ? ?PHYSICAL EXAM:  ?Physical Exam ?Vitals and nursing note reviewed.  ?Constitutional:   ?   General: She is not in acute distress. ?   Appearance: Normal appearance.  ?HENT:  ?   Head: Normocephalic and atraumatic.  ?   Mouth/Throat:  ?   Mouth: Mucous membranes are moist.  ?   Pharynx: Oropharynx is clear. No oropharyngeal exudate or posterior oropharyngeal erythema.  ?Eyes:  ?   General: No scleral icterus. ?   Extraocular Movements: Extraocular movements intact.  ?   Conjunctiva/sclera: Conjunctivae normal.  ?   Pupils: Pupils are equal, round, and reactive to light.  ?Cardiovascular:  ?   Rate and Rhythm: Normal rate and regular rhythm.  ?   Heart sounds: Normal heart sounds. No murmur heard. ?  No friction rub. No gallop.  ?Pulmonary:  ?   Effort: Pulmonary effort  is normal.  ?   Breath sounds: Normal breath sounds. No wheezing, rhonchi or rales.  ?Abdominal:  ?   General: There is no distension.  ?   Palpations: Abdomen is soft. There is no hepatomegaly, splenomegaly or mass.  ?   Tenderness: There is no abdominal tenderness.  ?Musculoskeletal:     ?   General: Normal range of motion.  ?   Cervical back: Normal range of motion and neck supple. No tenderness.  ?   Right lower leg: No edema.  ?    Left lower leg: No edema.  ?Lymphadenopathy:  ?   Cervical: No cervical adenopathy.  ?   Upper Body:  ?   Right upper body: No supraclavicular or axillary adenopathy.  ?   Left upper body: No supraclavicular or axillary adenopathy.  ?   Lower Body: No right inguinal adenopathy. No left inguinal adenopathy.  ?Skin: ?   General: Skin is warm and dry.  ?   Coloration: Skin is not jaundiced.  ?   Findings: No rash.  ?Neurological:  ?   Mental Status: She is alert and oriented to person, place, and time.  ?   Cranial Nerves: No cranial nerve deficit.  ?Psychiatric:     ?   Mood and Affect: Mood normal.     ?   Behavior: Behavior normal.     ?   Thought Content: Thought content normal.  ? ? ? ?LABS:  ? ?CBC and Differential ?Order: [id number] ? Ref Range & Units 2 wk ago  ?WBC 4.4 - 11.0 x 10*3/uL 3.9 Low    ?RBC 4.10 - 5.10 x 10*6/uL 3.93 Low    ?Hemoglobin 12.3 - 15.3 G/DL 13.6   ?Hematocrit 35.9 - 44.6 % 39.2   ?MCV 80.0 - 96.0 FL 100.0 High    ?MCH 27.5 - 33.2 PG 34.8 High    ?MCHC 33.0 - 37.0 G/DL 34.8   ?RDW 12.3 - 17.0 % 13.6   ?Platelets 150 - 450 X 10*3/uL 203   ?MPV 6.8 - 10.2 FL 8.3   ?Neutrophil % % 54   ?Lymphocyte % % 36   ?Monocyte % % 8   ?Eosinophil % % 1   ?Basophil % % 1   ?Neutrophil Absolute 1.8 - 7.8 x 10*3/uL 2.1   ?Lymphocyte Absolute 1.0 - 4.8 x 10*3/uL 1.4   ?Monocyte Absolute 0.0 - 0.8 x 10*3/uL 0.3   ?Eosinophil Absolute 0.0 - 0.5 x 10*3/uL 0.0   ?Basophil Absolute 0.0 - 0.2 x 10*3/uL 0.0   ?nRBC <=0 x 10*3/uL 0   ?Resulting Agency  [facility]  ?Specimen Collected: 07/12/21 09:39 Last Resulted: 07/12/21 14:57  ?Received From: [facility]  Result Received: 07/27/21 11:47  ? ? ?Comprehensive Metabolic Panel ?Order: [id number] ? Ref Range & Units 2 wk ago  ?Sodium 135 - 146 MMOL/L 141   ?Potassium 3.5 - 5.3 MMOL/L 4.5   ?Comment: NO VISIBLE HEMOLYSIS  ?Chloride 98 - 110 MMOL/L 105   ?CO2 21 - 31 MMOL/L 30   ?BUN 8 - 24 MG/DL 10   ?Glucose 70 - 99 MG/DL  96   ?Creatinine 0.60 - 1.20 MG/DL 0.77   ?Calcium 8.5 - 10.5 MG/DL 9.6   ?Total Protein 6.4 - 8.9 G/DL 6.7   ?Albumin  3.5 - 5.7 G/DL 4.4   ?Total Bilirubin 0.0 - 1.0 MG/DL 0.6   ?Alkaline Phosphatase 34 - 104 IU/L or U/L 54   ?  AST (SGOT) 13 - 39 IU/L or U/L 19   ?ALT (SGPT) 7 - 52 IU/L or U/L 20   ?Anion Gap 4 - 14 MMOL/L 6   ?Est. GFR >=60 ML/MIN/1.73 M*2 79   ?Comment: GFR estimated by CKD-EPI equations(NKF 2021).  ? ?"Recommend confirmation of Cr-based eGFR by using Cys-based eGFR and other filtration markers (if applicable) in complex cases and clinical decision-making, as needed."  ?Resulting Agency  [facility]  ?Specimen Collected: 07/12/21 09:39 Last Resulted: 07/12/21 15:41  ?Received From: [facility]  Result Received: 07/27/21 11:47  ? ? ?Protein Electrophoresis, Serum ?Order: [id number] ? Ref Range & Units 2 wk ago  ?PATHOLOGIST CHARGE ID  [id number]   ?Total Protein 6.4 - 8.9 G/DL 7.0   ?Alpha-1-Globulin 0.1 - 0.4 G/DL 0.3   ?Alpha-2-Globulin 0.4 - 1.2 G/DL 0.8   ?Beta-1- Globulin 0.4 - 0.7 G/DL 0.4   ?Beta-2- Globulin 0.1 - 0.4 G/DL 0.3   ?Gamma Globulin 0.5 - 1.6 G/DL 0.9   ?Comment SPE  M SPIKE IN FAR GAMMA REGION, TOO SMALL TO QUANTITATE (<0.2 G/DL)   ?Reviewed and Interpreted By   Hubbard Hartshorn M.D.   ?Albumin 3.5 - 5.0 G/DL 4.4   ?Resulting Agency  [facility]  ?Specimen Collected: 07/12/21 09:39 Last Resulted: 07/13/21 16:00  ?Received From: [facility]  Result Received: 07/27/21 11:47  ? ? ?IEOP/Immunofixation ?Order: [id number] ?Component 2 wk ago  ?PATHOLOGIST CHARGE ID  [id number]   ?SERUM IEOP COMMENT   IGG KAPPA   ?Reviewed and Interpreted By   Hubbard Hartshorn M.D.   ?Resulting Agency [facility]  ?Specimen Collected: 07/12/21 09:39 Last Resulted: 07/13/21 16:04  ?Received From: [facility]  Result Received: 07/27/21 11:47  ? ? ?Quant Immunoglobulin ?Order:  [id number] ? Ref Range & Units 8 d ago  ?IGG 635 - 1741 MG/DL 790   ?IGM 45 - 281 MG/DL 175   ?IGA 66 - 433 MG/DL 176   ?Resulting Agency  [facility]  ?Specimen Collected: 05/02/

## 2021-08-10 ENCOUNTER — Other Ambulatory Visit: Payer: Self-pay

## 2021-08-10 ENCOUNTER — Inpatient Hospital Stay: Payer: Medicare HMO

## 2021-08-10 DIAGNOSIS — Z79899 Other long term (current) drug therapy: Secondary | ICD-10-CM | POA: Diagnosis not present

## 2021-08-10 DIAGNOSIS — Z8249 Family history of ischemic heart disease and other diseases of the circulatory system: Secondary | ICD-10-CM | POA: Diagnosis not present

## 2021-08-10 DIAGNOSIS — D472 Monoclonal gammopathy: Secondary | ICD-10-CM

## 2021-08-10 DIAGNOSIS — Z8551 Personal history of malignant neoplasm of bladder: Secondary | ICD-10-CM | POA: Diagnosis not present

## 2021-08-10 DIAGNOSIS — R5383 Other fatigue: Secondary | ICD-10-CM | POA: Diagnosis not present

## 2021-08-10 LAB — PROTEIN, URINE, 24 HOUR
Collection Interval-UPROT: 24 hours
Protein, Urine: <6 mg/dL
Urine Total Volume-UPROT: 1250 mL

## 2022-01-21 ENCOUNTER — Ambulatory Visit: Payer: Medicare HMO | Attending: Cardiovascular Disease | Admitting: Cardiovascular Disease

## 2022-01-21 ENCOUNTER — Encounter: Payer: Self-pay | Admitting: Cardiovascular Disease

## 2022-01-21 DIAGNOSIS — I480 Paroxysmal atrial fibrillation: Secondary | ICD-10-CM

## 2022-01-21 DIAGNOSIS — E785 Hyperlipidemia, unspecified: Secondary | ICD-10-CM | POA: Diagnosis not present

## 2022-01-21 MED ORDER — ROSUVASTATIN CALCIUM 20 MG PO TABS: 20.0000 mg | ORAL_TABLET | Freq: Every day | ORAL | 3 refills | Status: DC

## 2022-01-21 NOTE — Assessment & Plan Note (Signed)
History of dyslipidemia on Crestor 10 mg a day with recent lipid profile performed by her PCP revealing a total cholesterol of 206, LDL of 120 and HDL of 59.  She is not at goal for primary prevention.  I am going to increase her Crestor from 10 to 20 mg a day and we will recheck a fasting lipid profile 3 months.

## 2022-01-21 NOTE — Progress Notes (Signed)
01/21/2022 Tami Bell   Dec 03, 1940  [id number]  Primary Physician Bess Harvest Ruthell Rummage, NP Primary Cardiologist: [name] Tami Bell, [place]  HPI:  Tami Bell is a 81 y.o.  mildly overweight married Caucasian female who states husband Tami Bell was also a patient of mine but unfortunately passed away 31-Oct-2019 of cancer.  They were married 25 years.  She is accompanied by one of her daughters Tami Bell today.. I last spoke to her on the phone for a telephone telemedicine visit 09/01/2018. She is the mother of 79, grandmother and 5 grandchildren. She was referred by [name] in [facility] for evaluation treatment of new onset symptomatic atrial fibrillation. Her only cardiovascular risk factor is treated hyperlipidemia. She does have an older brother who died of a myocardial infarction and had bypass surgery. She has never had a heart attack or stroke. She denies chest pain but has had fairly recent increase in dyspnea on exertion. There is no history of bleeding problems. She noticed increased heart rate and fatigue 2-3 weeks prior to seeing me late last yearand saw her primary care physician who documented new onset A. Fib with RVR. He began low-dose beta blocker and oral anticoagulation resulting in improvement in her symptoms. Routine lab work was performed and apparently her thyroid function tests were normal.she was hospitalized in December with A. Fib with RVR and spontaneously converted to sinus rhythm. She was complaining of weakness and increasing dyspnea on exertion. Recent Myoview stress test showed no ischemia but had decreased ejection fraction and 2-D echo revealed an EF of 45-50% with mild global hypokinesia. I performed outpatient cardiac catheterization on her 05/05/14 via the right radial approach revealing normal coronary arteries and low normal LV function. Since that time her dyspnea has markedly improved. She wore a 1 month event monitor that showed no  evidence of PAF and as a result I decided to stop her Xarelto . Since I saw her 6 months ago she was hospitalized for daily quite pneumonia was noted to have PACs and MAT but no PAF was demonstrated.   Since I broke to her 3 years ago she continues to do well.  She said no episodes of PAF.  She denies chest pain or shortness of breath.  She does currently live alone, and is independent.  She drives, cooks for herself and does her own chores.  She volunteers at the [employer] 3 days a week and walks on a daily basis.   Current Meds  Medication Sig   alendronate (FOSAMAX) 70 MG tablet Take by mouth.   ALPRAZolam (XANAX) 0.25 MG tablet Take 0.5 tablets by mouth daily as needed for anxiety.    aspirin EC 81 MG tablet Take 1 tablet (81 mg total) by mouth daily.   Calcium Carbonate-Vitamin D (CALCIUM + D PO) Take 1 tablet by mouth daily.   diltiazem (CARDIZEM) 30 MG tablet Take 1 tablet (30 mg total) by mouth 2 (two) times daily.   dorzolamide-timolol (COSOPT) 22.3-6.8 MG/ML ophthalmic solution SMARTSIG:In Eye(s)   guaiFENesin (MUCINEX) 600 MG 12 hr tablet Take 600 mg by mouth 2 (two) times daily as needed.   Latanoprostene Bunod (VYZULTA) 0.024 % SOLN Place 1 drop into the right eye daily.   Multiple Vitamins-Minerals (MULTIVITAMIN WITH MINERALS) tablet Take 1 tablet by mouth daily.   rosuvastatin (CRESTOR) 10 MG tablet Take 1 tablet by mouth daily.   sertraline (ZOLOFT) 50 MG tablet Take 1 tablet by mouth  daily.   timolol (TIMOPTIC) 0.25 % ophthalmic solution Place 1 drop into both eyes 2 (two) times daily.      No Known Allergies  Social History   Socioeconomic History   Marital status: Widowed    Spouse name: Not on file   Number of children: 3   Years of education: 10   Highest education level: 10th grade  Occupational History   Occupation: furniture / previously    Comment: Radio broadcast assistant current  Tobacco Use   Smoking status: Never   Smokeless tobacco: Never   Vaping Use   Vaping Use: Never used  Substance and Sexual Activity   Alcohol use: No    Alcohol/week: 0.0 standard drinks of alcohol   Drug use: No   Sexual activity: Not on file  Other Topics Concern   Not on file  Social History Narrative   Not on file   Social Determinants of Health   Financial Resource Strain: Not on file  Food Insecurity: Not on file  Transportation Needs: Not on file  Physical Activity: Not on file  Stress: Not on file  Social Connections: Not on file  Intimate Partner Violence: Not on file     Review of Systems: General: negative for chills, fever, night sweats or weight changes.  Cardiovascular: negative for chest pain, dyspnea on exertion, edema, orthopnea, palpitations, paroxysmal nocturnal dyspnea or shortness of breath Dermatological: negative for rash Respiratory: negative for cough or wheezing Urologic: negative for hematuria Abdominal: negative for nausea, vomiting, diarrhea, bright red blood per rectum, melena, or hematemesis Neurologic: negative for visual changes, syncope, or dizziness All other systems reviewed and are otherwise negative except as noted above.    Blood pressure (!) 120/56, pulse (!) 58, height '4\' 11"'$  (1.499 m), weight 98 lb 3.2 oz (44.5 kg), SpO2 94 %.  General appearance: alert and no distress Neck: no adenopathy, no JVD, supple, symmetrical, trachea midline, thyroid not enlarged, symmetric, no tenderness/mass/nodules, and soft left carotid bruit Lungs: clear to auscultation bilaterally Heart: regular rate and rhythm, S1, S2 normal, no murmur, click, rub or gallop Extremities: extremities normal, atraumatic, no cyanosis or edema Pulses: 2+ and symmetric Skin: Skin color, texture, turgor normal. No rashes or lesions Neurologic: Grossly normal  EKG sinus bradycardia at 58 with incomplete right bundle branch block.  I personally reviewed this EKG.  ASSESSMENT AND PLAN:   Paroxysmal atrial fibrillation (HCC) History  of PAF which she has not had in years.  I elected to stop her Xarelto when I saw her 3 years ago.  Dyslipidemia History of dyslipidemia on Crestor 10 mg a day with recent lipid profile performed by her PCP revealing a total cholesterol of 206, LDL of 120 and HDL of 59.  She is not at goal for primary prevention.  I am going to increase her Crestor from 10 to 20 mg a day and we will recheck a fasting lipid profile 3 months.     [name] FACP,FACC,FAHA, [facility] 01/21/2022 3:05 PM

## 2022-01-21 NOTE — Patient Instructions (Signed)
Medication Instructions:  Your physician has recommended you make the following change in your medication:   -Increase rosuvastatin (crestor) '20mg'$  once daily.   *If you need a refill on your cardiac medications before your next appointment, please call your pharmacy*   Lab Work: Your physician recommends that you return for lab work in: 3 months for FASTING lipid/liver panel.  If you have labs (blood work) drawn today and your tests are completely normal, you will receive your results only by: [facility] (if you have MyChart) OR A paper copy in the mail If you have any lab test that is abnormal or we need to change your treatment, we will call you to review the results.   Testing/Procedures: Your physician has requested that you have a carotid duplex. This test is an ultrasound of the carotid arteries in your neck. It looks at blood flow through these arteries that supply the brain with blood. Allow one hour for this exam. There are no restrictions or special instructions. This procedure will be done at [facility]   Follow-Up: At [facility], you and your health needs are our priority.  As part of our continuing mission to provide you with exceptional heart care, we have created designated Provider Care Teams.  These Care Teams include your primary Cardiologist (physician) and Advanced Practice Providers (APPs -  Physician Assistants and Nurse Practitioners) who all work together to provide you with the care you need, when you need it.  We recommend signing up for the patient portal called "MyChart".  Sign up information is provided on this After Visit Summary.  MyChart is used to connect with patients for Virtual Visits (Telemedicine).  Patients are able to view lab/test results, encounter notes, upcoming appointments, etc.  Non-urgent messages can be sent to your provider as well.   To learn more about what you can do with MyChart, go to  [email or web address].    Your next appointment:   12 month(s)  The format for your next appointment:   In Person  Provider:   Quay Burow, MD

## 2022-01-21 NOTE — Assessment & Plan Note (Signed)
History of PAF which she has not had in years.  I elected to stop her Xarelto when I saw her 3 years ago.

## 2022-01-22 ENCOUNTER — Other Ambulatory Visit: Payer: Self-pay | Admitting: Cardiovascular Disease

## 2022-01-22 DIAGNOSIS — R0989 Other specified symptoms and signs involving the circulatory and respiratory systems: Secondary | ICD-10-CM

## 2022-01-28 ENCOUNTER — Other Ambulatory Visit: Payer: Self-pay | Admitting: Hematology and Oncology

## 2022-01-28 ENCOUNTER — Ambulatory Visit (HOSPITAL_COMMUNITY)
Admission: RE | Admit: 2022-01-28 | Discharge: 2022-01-28 | Disposition: A | Discharge status: Home / Self Care | Payer: Medicare HMO | Source: Ambulatory Visit | Attending: Cardiovascular Disease | Admitting: Cardiovascular Disease

## 2022-01-28 DIAGNOSIS — D472 Monoclonal gammopathy: Secondary | ICD-10-CM

## 2022-01-28 DIAGNOSIS — R0989 Other specified symptoms and signs involving the circulatory and respiratory systems: Secondary | ICD-10-CM | POA: Diagnosis present

## 2022-01-28 NOTE — Progress Notes (Unsigned)
[facility]  [address] [place],  [place]  [address] [phone]  Clinic Day:  01/29/2022  Referring physician: Bess Harvest*   HISTORY OF PRESENT ILLNESS:  The patient is a 81 y.o. female with monoclonal gammopathy of uncertain significance, IgG kappa. Her M-spike was < 0.2 in May and quantitative immunoglobulins and serum light chains were normal.. She is here for repeat clinical assessment notes fatigue, but otherwise denies complaints.  REVIEW OF SYSTEMS:  Review of Systems  Constitutional:  Negative for appetite change, chills, fatigue, fever and unexpected weight change.  HENT:   Negative for lump/mass, mouth sores and sore throat.   Respiratory:  Negative for cough and shortness of breath.   Cardiovascular:  Negative for chest pain and leg swelling.  Gastrointestinal:  Negative for abdominal pain, constipation, diarrhea, nausea and vomiting.  Endocrine: Negative for hot flashes.  Genitourinary:  Negative for difficulty urinating, dysuria, frequency and hematuria.   Musculoskeletal:  Negative for arthralgias, back pain and myalgias.  Skin:  Negative for rash.  Neurological:  Negative for dizziness and headaches.  Hematological:  Negative for adenopathy. Does not bruise/bleed easily.  Psychiatric/Behavioral:  Negative for depression and sleep disturbance. The patient is not nervous/anxious.       PHYSICAL EXAM:  Blood pressure (!) 144/60, pulse 60, temperature 98.5 F (36.9 C), temperature source Oral, resp. rate 16, height '4\' 11"'$  (1.499 m), weight 98 lb 11.2 oz (44.8 kg), SpO2 97 %. Wt Readings from Last 3 Encounters:  01/29/22 98 lb 11.2 oz (44.8 kg)  01/21/22 98 lb 3.2 oz (44.5 kg)  08/01/21 96 lb 4.8 oz (43.7 kg)   Body mass index is 19.93 kg/m.  Performance status (ECOG): 1 - Symptomatic but completely ambulatory  Physical Exam Vitals and nursing note reviewed.  Constitutional:      General: She is not in acute  distress.    Appearance: Normal appearance.  HENT:     Head: Normocephalic and atraumatic.     Mouth/Throat:     Mouth: Mucous membranes are moist.     Pharynx: Oropharynx is clear. No oropharyngeal exudate or posterior oropharyngeal erythema.  Eyes:     General: No scleral icterus.    Extraocular Movements: Extraocular movements intact.     Conjunctiva/sclera: Conjunctivae normal.     Pupils: Pupils are equal, round, and reactive to light.  Cardiovascular:     Rate and Rhythm: Normal rate and regular rhythm.     Heart sounds: Normal heart sounds. No murmur heard.    No friction rub. No gallop.  Pulmonary:     Effort: Pulmonary effort is normal.     Breath sounds: Normal breath sounds. No wheezing, rhonchi or rales.  Abdominal:     General: There is no distension.     Palpations: Abdomen is soft. There is no hepatomegaly, splenomegaly or mass.     Tenderness: There is no abdominal tenderness.  Musculoskeletal:        General: Normal range of motion.     Cervical back: Normal range of motion and neck supple. No tenderness.     Right lower leg: No edema.     Left lower leg: No edema.  Lymphadenopathy:     Cervical: No cervical adenopathy.     Upper Body:     Right upper body: No supraclavicular or axillary adenopathy.     Left upper body: No supraclavicular or axillary adenopathy.     Lower Body: No right inguinal adenopathy.  No left inguinal adenopathy.  Skin:    General: Skin is warm and dry.     Coloration: Skin is not jaundiced.     Findings: No rash.  Neurological:     Mental Status: She is alert and oriented to person, place, and time.     Cranial Nerves: No cranial nerve deficit.  Psychiatric:        Mood and Affect: Mood normal.        Behavior: Behavior normal.        Thought Content: Thought content normal.     LABS:      Latest Ref Rng & Units 01/29/2022    9:12 AM 04/29/2014   12:32 PM 03/07/2014   10:48 AM  CBC  WBC 4.0 - 10.5 K/uL 4.7  4.9  4.7    Hemoglobin 12.0 - 15.0 g/dL 13.5  14.5  14.7   Hematocrit 36.0 - 46.0 % 41.2  41.9  43.9   Platelets 150 - 400 K/uL 184  199  209       Latest Ref Rng & Units 01/29/2022    9:12 AM 09/05/2020    3:25 PM 04/29/2014   12:32 PM  CMP  Glucose 70 - 99 mg/dL 115  84  92   BUN 8 - 23 mg/dL '9  12  14   '$ Creatinine 0.44 - 1.00 mg/dL 0.75  0.53  0.76   Sodium 135 - 145 mmol/L 143  143  140   Potassium 3.5 - 5.1 mmol/L 3.9  4.2  4.4   Chloride 98 - 111 mmol/L 103  105  104   CO2 22 - 32 mmol/L '29  25  28   '$ Calcium 8.9 - 10.3 mg/dL 9.7  9.4  9.6   Total Protein 6.5 - 8.1 g/dL 7.0     Total Bilirubin 0.3 - 1.2 mg/dL 0.5     Alkaline Phos 38 - 126 U/L 50     AST 15 - 41 U/L 26     ALT 0 - 44 U/L 23        No results found for: "CEA1", "CEA" / No results found for: "CEA1", "CEA" No results found for: "PSA1" No results found for: "YBF383" No results found for: "CAN125"  No results found for: "TOTALPROTELP", "ALBUMINELP", "A1GS", "A2GS", "BETS", "BETA2SER", "GAMS", "MSPIKE", "SPEI" No results found for: "TIBC", "FERRITIN", "IRONPCTSAT" No results found for: "LDH"  No results found for: "AFPTUMOR", "TOTALPROTELP", "ALBUMINELP", "A1GS", "A2GS", "BETS", "BETA2SER", "GAMS", "MSPIKE", "SPEI", "LDH", "CEA1", "CEA", "PSA1", "IGASERUM", "IGGSERUM", "IGMSERUM", "THGAB", "THYROGLB"  Review Flowsheet        No data to display           STUDIES:  VAS US CAROTID  Result Date: 01/28/2022 Carotid Arterial Duplex Study Patient Name:  ABRIANA SALTOS  Date of Exam:   01/28/2022 Medical Rec #: [id number]    Accession #:    [id number] Date of Birth: [birth year]    Patient Gender: F Patient Age:   48 years Exam Location:  [facility] Procedure:      VAS US CAROTID Referring Phys: Roderic Palau BERRY --------------------------------------------------------------------------------  Indications:  Soft left bruit. Patient denies any cerebrovascular symptoms at               this time. Risk Factors: Hyperlipidemia, no history  of smoking. Performing Technologist: Mariane Masters RVT  Examination Guidelines: A complete evaluation includes B-mode imaging, spectral Doppler, color Doppler, and power Doppler as needed of all accessible portions of each vessel.  Bilateral testing is considered an integral part of a complete examination. Limited examinations for reoccurring indications may be performed as noted.  Right Carotid Findings: +----------+--------+--------+--------+------------------+------------------+           PSV cm/sEDV cm/sStenosisPlaque DescriptionComments           +----------+--------+--------+--------+------------------+------------------+ CCA Prox  79      12                                                   +----------+--------+--------+--------+------------------+------------------+ CCA Distal68      13                                intimal thickening +----------+--------+--------+--------+------------------+------------------+ ICA Prox  54      10                                intimal thickening +----------+--------+--------+--------+------------------+------------------+ ICA Mid   69      19      Normal                                       +----------+--------+--------+--------+------------------+------------------+ ICA Distal97      24                                                   +----------+--------+--------+--------+------------------+------------------+ ECA       68      7                                                    +----------+--------+--------+--------+------------------+------------------+ +----------+--------+-------+----------------+-------------------+           PSV cm/sEDV cmsDescribe        Arm Pressure (mmHG) +----------+--------+-------+----------------+-------------------+ [id number]     0      Multiphasic, [id number]                 +----------+--------+-------+----------------+-------------------+  +---------+--------+--+--------+--+---------+ VertebralPSV cm/s66EDV cm/s17Antegrade +---------+--------+--+--------+--+---------+  Left Carotid Findings: +----------+--------+--------+--------+------------------+--------+           PSV cm/sEDV cm/sStenosisPlaque DescriptionComments +----------+--------+--------+--------+------------------+--------+ CCA Prox  74      14                                         +----------+--------+--------+--------+------------------+--------+ CCA Distal76      18                                         +----------+--------+--------+--------+------------------+--------+ ICA Prox  68      12                                         +----------+--------+--------+--------+------------------+--------+  ICA Mid   96      23      Normal                             +----------+--------+--------+--------+------------------+--------+ ICA Distal87      24                                         +----------+--------+--------+--------+------------------+--------+ ECA       54      8                                          +----------+--------+--------+--------+------------------+--------+ +----------+--------+--------+----------------+-------------------+           PSV cm/sEDV cm/sDescribe        Arm Pressure (mmHG) +----------+--------+--------+----------------+-------------------+ Subclavian113     1       Multiphasic, [id number]                 +----------+--------+--------+----------------+-------------------+ +---------+--------+--+--------+--+---------+ VertebralPSV cm/s72EDV cm/s15Antegrade +---------+--------+--+--------+--+---------+   Summary: Right Carotid: There is no evidence of stenosis in the right ICA. The                extracranial vessels were near-normal with only minimal wall                thickening or plaque. Left Carotid: There is no evidence of stenosis in the left ICA. The extracranial                vessels were near-normal with only minimal wall thickening or               plaque. Vertebrals:  Bilateral vertebral arteries demonstrate antegrade flow. Subclavians: Normal flow hemodynamics were seen in bilateral subclavian              arteries. *See table(s) above for measurements and observations.     Preliminary       ASSESSMENT & PLAN:   Assessment/Plan:  81 y.o. female with monoclonal gammopathy of uncertain significance.  Serum protein electrophoresis is pending from today.  If it remains stable,I will plan to see her back in 6 months for repeat clinical assessment  The patient understands all the plans discussed today and is in agreement with them.  She knows to contact our office if she develops concerns prior to her next appointment    Marvia Pickles, PA-C

## 2022-01-29 ENCOUNTER — Inpatient Hospital Stay: Payer: Medicare HMO | Admitting: Hematology and Oncology

## 2022-01-29 ENCOUNTER — Telehealth: Payer: Self-pay

## 2022-01-29 ENCOUNTER — Inpatient Hospital Stay: Payer: Medicare HMO | Attending: Hematology and Oncology

## 2022-01-29 ENCOUNTER — Other Ambulatory Visit: Payer: Self-pay | Admitting: Hematology and Oncology

## 2022-01-29 ENCOUNTER — Encounter: Payer: Self-pay | Admitting: Hematology and Oncology

## 2022-01-29 VITALS — BP 144/60 | HR 60 | Temp 98.5°F | Resp 16 | Ht 59.0 in | Wt 98.7 lb

## 2022-01-29 DIAGNOSIS — D472 Monoclonal gammopathy: Secondary | ICD-10-CM | POA: Insufficient documentation

## 2022-01-29 DIAGNOSIS — R5383 Other fatigue: Secondary | ICD-10-CM | POA: Insufficient documentation

## 2022-01-29 DIAGNOSIS — Z79899 Other long term (current) drug therapy: Secondary | ICD-10-CM | POA: Diagnosis not present

## 2022-01-29 LAB — CMP (CANCER CENTER ONLY)
ALT: 23 U/L (ref 0–44)
AST: 26 U/L (ref 15–41)
Albumin: 4.2 g/dL (ref 3.5–5.0)
Alkaline Phosphatase: 50 U/L (ref 38–126)
Anion gap: 11 (ref 5–15)
BUN: 9 mg/dL (ref 8–23)
CO2: 29 mmol/L (ref 22–32)
Calcium: 9.7 mg/dL (ref 8.9–10.3)
Chloride: 103 mmol/L (ref 98–111)
Creatinine: 0.75 mg/dL (ref 0.44–1.00)
GFR, Estimated: >60 mL/min (ref >60)
Glucose, Bld: 115 mg/dL — ABNORMAL HIGH (ref 70–99)
Potassium: 3.9 mmol/L (ref 3.5–5.1)
Sodium: 143 mmol/L (ref 135–145)
Total Bilirubin: 0.5 mg/dL (ref 0.3–1.2)
Total Protein: 7 g/dL (ref 6.5–8.1)

## 2022-01-29 LAB — CBC WITH DIFFERENTIAL (CANCER CENTER ONLY)
Abs Immature Granulocytes: 0.01 10*3/uL (ref 0.00–0.07)
Basophils Absolute: 0 10*3/uL (ref 0.0–0.1)
Basophils Relative: 1 %
Eosinophils Absolute: 0 10*3/uL (ref 0.0–0.5)
Eosinophils Relative: 1 %
HCT: 41.2 % (ref 36.0–46.0)
Hemoglobin: 13.5 g/dL (ref 12.0–15.0)
Immature Granulocytes: 0 %
Lymphocytes Relative: 36 %
Lymphs Abs: 1.7 10*3/uL (ref 0.7–4.0)
MCH: 33.6 pg (ref 26.0–34.0)
MCHC: 32.8 g/dL (ref 30.0–36.0)
MCV: 102.5 fL — ABNORMAL HIGH (ref 80.0–100.0)
Monocytes Absolute: 0.4 10*3/uL (ref 0.1–1.0)
Monocytes Relative: 9 %
Neutro Abs: 2.5 10*3/uL (ref 1.7–7.7)
Neutrophils Relative %: 53 %
Platelet Count: 184 10*3/uL (ref 150–400)
RBC: 4.02 MIL/uL (ref 3.87–5.11)
RDW: 12.2 % (ref 11.5–15.5)
WBC Count: 4.7 10*3/uL (ref 4.0–10.5)
nRBC: 0 % (ref 0.0–0.2)

## 2022-01-29 NOTE — Telephone Encounter (Signed)
Patient notified, voiced understanding.

## 2022-01-29 NOTE — Telephone Encounter (Signed)
-----   Message from Marvia Pickles, PA-C sent at 01/29/2022 12:14 PM EST ----- Please tell her her blood counts and chemistries are normal. We will let her know about the other tests when we get the results. Thanks

## 2022-02-01 ENCOUNTER — Ambulatory Visit: Payer: Medicare HMO | Admitting: Hematology and Oncology

## 2022-02-01 ENCOUNTER — Other Ambulatory Visit: Payer: Medicare HMO

## 2022-02-01 LAB — MISC LABCORP TEST (SEND OUT): Labcorp test code: 3715

## 2022-02-04 LAB — MULTIPLE MYELOMA PANEL, SERUM
Albumin SerPl Elph-Mcnc: 4 g/dL (ref 2.9–4.4)
Albumin/Glob SerPl: 1.5 (ref 0.7–1.7)
Alpha 1: 0.2 g/dL (ref 0.0–0.4)
Alpha2 Glob SerPl Elph-Mcnc: 0.7 g/dL (ref 0.4–1.0)
B-Globulin SerPl Elph-Mcnc: 0.8 g/dL (ref 0.7–1.3)
Gamma Glob SerPl Elph-Mcnc: 1 g/dL (ref 0.4–1.8)
Globulin, Total: 2.8 g/dL (ref 2.2–3.9)
IgA: 167 mg/dL (ref 64–422)
IgG (Immunoglobin G), Serum: 911 mg/dL (ref 586–1602)
IgM (Immunoglobulin M), Srm: 171 mg/dL (ref 26–217)
M Protein SerPl Elph-Mcnc: 0.2 g/dL — ABNORMAL HIGH
Total Protein ELP: 6.8 g/dL (ref 6.0–8.5)

## 2022-02-05 ENCOUNTER — Other Ambulatory Visit: Payer: Self-pay | Admitting: Hematology and Oncology

## 2022-02-05 DIAGNOSIS — D472 Monoclonal gammopathy: Secondary | ICD-10-CM

## 2022-02-05 LAB — KAPPA/LAMBDA LIGHT CHAINS
Kappa free light chain: 22.2 mg/L — ABNORMAL HIGH (ref 3.3–19.4)
Kappa, lambda light chain ratio: 1.8 — ABNORMAL HIGH (ref 0.26–1.65)
Lambda free light chains: 12.3 mg/L (ref 5.7–26.3)

## 2022-02-06 ENCOUNTER — Telehealth: Payer: Self-pay

## 2022-02-06 NOTE — Telephone Encounter (Signed)
Labs remain stable, no protein in urine Notified daughter she voiced understanding.

## 2022-04-23 ENCOUNTER — Other Ambulatory Visit: Payer: Self-pay | Admitting: Cardiovascular Disease

## 2022-04-24 LAB — HEPATIC FUNCTION PANEL
ALT: 8 IU/L (ref 0–32)
AST: 17 IU/L (ref 0–40)
Albumin: 4.4 g/dL (ref 3.8–4.8)
Alkaline Phosphatase: 61 IU/L (ref 44–121)
Bilirubin Total: 0.5 mg/dL (ref 0.0–1.2)
Bilirubin, Direct: 0.14 mg/dL (ref 0.00–0.40)
Total Protein: 6.6 g/dL (ref 6.0–8.5)

## 2022-04-24 LAB — LIPID PANEL
Chol/HDL Ratio: 3.4 ratio (ref 0.0–4.4)
Cholesterol, Total: 172 mg/dL (ref 100–199)
HDL: 50 mg/dL (ref >39)
LDL Chol Calc (NIH): 100 mg/dL — ABNORMAL HIGH (ref 0–99)
Triglycerides: 125 mg/dL (ref 0–149)
VLDL Cholesterol Cal: 22 mg/dL (ref 5–40)

## 2022-07-23 ENCOUNTER — Inpatient Hospital Stay: Payer: Medicare HMO | Attending: Hematology & Oncology

## 2022-07-23 DIAGNOSIS — Z79899 Other long term (current) drug therapy: Secondary | ICD-10-CM | POA: Diagnosis not present

## 2022-07-23 DIAGNOSIS — D472 Monoclonal gammopathy: Secondary | ICD-10-CM | POA: Diagnosis present

## 2022-07-23 LAB — CBC WITH DIFFERENTIAL (CANCER CENTER ONLY)
Abs Immature Granulocytes: 0.01 10*3/uL (ref 0.00–0.07)
Basophils Absolute: 0 10*3/uL (ref 0.0–0.1)
Basophils Relative: 0 %
Eosinophils Absolute: 0.1 10*3/uL (ref 0.0–0.5)
Eosinophils Relative: 1 %
HCT: 40.1 % (ref 36.0–46.0)
Hemoglobin: 12.9 g/dL (ref 12.0–15.0)
Immature Granulocytes: 0 %
Lymphocytes Relative: 37 %
Lymphs Abs: 1.8 10*3/uL (ref 0.7–4.0)
MCH: 32.7 pg (ref 26.0–34.0)
MCHC: 32.2 g/dL (ref 30.0–36.0)
MCV: 101.8 fL — ABNORMAL HIGH (ref 80.0–100.0)
Monocytes Absolute: 0.5 10*3/uL (ref 0.1–1.0)
Monocytes Relative: 9 %
Neutro Abs: 2.6 10*3/uL (ref 1.7–7.7)
Neutrophils Relative %: 53 %
Platelet Count: 181 10*3/uL (ref 150–400)
RBC: 3.94 MIL/uL (ref 3.87–5.11)
RDW: 12.3 % (ref 11.5–15.5)
WBC Count: 4.9 10*3/uL (ref 4.0–10.5)
nRBC: 0 % (ref 0.0–0.2)

## 2022-07-23 LAB — CMP (CANCER CENTER ONLY)
ALT: 14 U/L (ref 0–44)
AST: 18 U/L (ref 15–41)
Albumin: 4 g/dL (ref 3.5–5.0)
Alkaline Phosphatase: 47 U/L (ref 38–126)
Anion gap: 7 (ref 5–15)
BUN: 15 mg/dL (ref 8–23)
CO2: 29 mmol/L (ref 22–32)
Calcium: 9.3 mg/dL (ref 8.9–10.3)
Chloride: 102 mmol/L (ref 98–111)
Creatinine: 0.76 mg/dL (ref 0.44–1.00)
GFR, Estimated: >60 mL/min (ref >60)
Glucose, Bld: 93 mg/dL (ref 70–99)
Potassium: 4 mmol/L (ref 3.5–5.1)
Sodium: 138 mmol/L (ref 135–145)
Total Bilirubin: 0.6 mg/dL (ref 0.3–1.2)
Total Protein: 6.8 g/dL (ref 6.5–8.1)

## 2022-07-24 LAB — KAPPA/LAMBDA LIGHT CHAINS
Kappa free light chain: 17.4 mg/L (ref 3.3–19.4)
Kappa, lambda light chain ratio: 1.31 (ref 0.26–1.65)
Lambda free light chains: 13.3 mg/L (ref 5.7–26.3)

## 2022-07-26 NOTE — Progress Notes (Unsigned)
[facility]  [address] [place],  [place]  [address] [phone]  Clinic Day:  07/30/2022  Referring physician: Rhea Bleacher*   HISTORY OF PRESENT ILLNESS:  The patient is a 82 y.o. female with monoclonal gammopathy of uncertain significance diagnosed in May 2023.  She has not had evidence of progression to multiple myeloma.  She is here for repeat clinical assessment and denies complaints.  REVIEW OF SYSTEMS:  Review of Systems  Constitutional:  Negative for appetite change, chills, fatigue, fever and unexpected weight change.  HENT:   Negative for lump/mass, mouth sores and sore throat.   Respiratory:  Negative for cough and shortness of breath.   Cardiovascular:  Negative for chest pain and leg swelling.  Gastrointestinal:  Negative for abdominal pain, constipation, diarrhea, nausea and vomiting.  Endocrine: Negative for hot flashes.  Genitourinary:  Negative for difficulty urinating, dysuria, frequency and hematuria.   Musculoskeletal:  Negative for arthralgias, back pain and myalgias.  Skin:  Negative for rash.  Neurological:  Negative for dizziness and headaches.  Hematological:  Negative for adenopathy. Does not bruise/bleed easily.  Psychiatric/Behavioral:  Negative for depression and sleep disturbance. The patient is not nervous/anxious.      PHYSICAL EXAM:  Blood pressure (!) 132/97, pulse (!) 58, temperature 98.6 F (37 C), temperature source Oral, resp. rate 20, height 4\' 11"  (1.499 m), weight 99 lb 4.8 oz (45 kg), SpO2 99 %. Wt Readings from Last 3 Encounters:  07/30/22 99 lb 4.8 oz (45 kg)  01/29/22 98 lb 11.2 oz (44.8 kg)  01/21/22 98 lb 3.2 oz (44.5 kg)   Body mass index is 20.06 kg/m.  Performance status (ECOG): 1 - Symptomatic but completely ambulatory  Physical Exam Vitals and nursing note reviewed.  Constitutional:      General: She is not in acute distress.    Appearance: Normal appearance.  HENT:      Head: Normocephalic and atraumatic.     Mouth/Throat:     Mouth: Mucous membranes are moist.     Pharynx: Oropharynx is clear. No oropharyngeal exudate or posterior oropharyngeal erythema.  Eyes:     General: No scleral icterus.    Extraocular Movements: Extraocular movements intact.     Conjunctiva/sclera: Conjunctivae normal.     Pupils: Pupils are equal, round, and reactive to light.  Cardiovascular:     Rate and Rhythm: Normal rate and regular rhythm.     Heart sounds: Normal heart sounds. No murmur heard.    No friction rub. No gallop.  Pulmonary:     Effort: Pulmonary effort is normal.     Breath sounds: Normal breath sounds. No wheezing, rhonchi or rales.  Abdominal:     General: There is no distension.     Palpations: Abdomen is soft. There is no hepatomegaly, splenomegaly or mass.     Tenderness: There is no abdominal tenderness.  Musculoskeletal:        General: Normal range of motion.     Cervical back: Normal range of motion and neck supple. No tenderness.     Right lower leg: No edema.     Left lower leg: No edema.  Lymphadenopathy:     Cervical: No cervical adenopathy.     Upper Body:     Right upper body: No supraclavicular or axillary adenopathy.     Left upper body: No supraclavicular or axillary adenopathy.     Lower Body: No right inguinal adenopathy. No left inguinal adenopathy.  Skin:    General: Skin is warm and dry.     Coloration: Skin is not jaundiced.     Findings: No rash.  Neurological:     Mental Status: She is alert and oriented to person, place, and time.     Cranial Nerves: No cranial nerve deficit.  Psychiatric:        Mood and Affect: Mood normal.        Behavior: Behavior normal.        Thought Content: Thought content normal.     LABS:      Latest Ref Rng & Units 07/23/2022    9:41 AM 01/29/2022    9:12 AM 04/29/2014   12:32 PM  CBC  WBC 4.0 - 10.5 K/uL 4.9  4.7  4.9   Hemoglobin 12.0 - 15.0 g/dL 52.8  41.3  24.4   Hematocrit 36.0  - 46.0 % 40.1  41.2  41.9   Platelets 150 - 400 K/uL 181  184  199       Latest Ref Rng & Units 07/23/2022    9:41 AM 04/23/2022    9:12 AM 01/29/2022    9:12 AM  CMP  Glucose 70 - 99 mg/dL 93   010   BUN 8 - 23 mg/dL 15   9   Creatinine 2.72 - 1.00 mg/dL 5.36   6.44   Sodium 034 - 145 mmol/L 138   143   Potassium 3.5 - 5.1 mmol/L 4.0   3.9   Chloride 98 - 111 mmol/L 102   103   CO2 22 - 32 mmol/L 29   29   Calcium 8.9 - 10.3 mg/dL 9.3   9.7   Total Protein 6.5 - 8.1 g/dL 6.8  6.6  7.0   Total Bilirubin 0.3 - 1.2 mg/dL 0.6  0.5  0.5   Alkaline Phos 38 - 126 U/L 47  61  50   AST 15 - 41 U/L 18  17  26    ALT 0 - 44 U/L 14  8  23       Lab Results  Component Value Date   TOTALPROTELP 6.6 07/23/2022        Component Value Date/Time   TOTALPROTELP 6.6 07/23/2022 0941   IGGSERUM 976 07/23/2022 0941   IGMSERUM 170 07/23/2022 0941    Review Flowsheet       Latest Ref Rng & Units 01/29/2022 07/23/2022  Oncology Labs  Total Protein ELP 6.0 - 8.5 g/dL 6.8  C 6.6  C  IgG (Immunoglobin G), Serum 586 - 1,602 mg/dL 742  595   IgM, Serum 26 - 217 mg/dL 638  756     Details      C Corrected result          STUDIES:  No results found.    ASSESSMENT & PLAN:   Assessment/Plan:  82 y.o. female with monoclonal gammopathy of uncertain significance.  CBC and complete metabolic profile are normal. Serum protein electrophoresis from April 29 reveals a stable M-spike of 0.2.  Quantitative immunoglobulins and serum light chains are normal. I will plan to see her back in 6 months for repeat clinical assessment  The patient understands all the plans discussed today and is in agreement with them.  She knows to contact our office if she develops concerns prior to her next appointment    Adah Perl, PA-C

## 2022-07-29 LAB — MULTIPLE MYELOMA PANEL, SERUM
Albumin SerPl Elph-Mcnc: 3.7 g/dL (ref 2.9–4.4)
Albumin/Glob SerPl: 1.3 (ref 0.7–1.7)
Alpha 1: 0.2 g/dL (ref 0.0–0.4)
Alpha2 Glob SerPl Elph-Mcnc: 0.8 g/dL (ref 0.4–1.0)
B-Globulin SerPl Elph-Mcnc: 0.9 g/dL (ref 0.7–1.3)
Gamma Glob SerPl Elph-Mcnc: 1 g/dL (ref 0.4–1.8)
Globulin, Total: 2.9 g/dL (ref 2.2–3.9)
IgA: 154 mg/dL (ref 64–422)
IgG (Immunoglobin G), Serum: 976 mg/dL (ref 586–1602)
IgM (Immunoglobulin M), Srm: 170 mg/dL (ref 26–217)
M Protein SerPl Elph-Mcnc: 0.2 g/dL — ABNORMAL HIGH
Total Protein ELP: 6.6 g/dL (ref 6.0–8.5)

## 2022-07-30 ENCOUNTER — Encounter: Payer: Self-pay | Admitting: Hematology and Oncology

## 2022-07-30 ENCOUNTER — Inpatient Hospital Stay: Payer: Medicare HMO | Attending: Hematology & Oncology | Admitting: Hematology and Oncology

## 2022-07-30 VITALS — BP 132/97 | HR 58 | Temp 98.6°F | Resp 20 | Ht 59.0 in | Wt 99.3 lb

## 2022-07-30 DIAGNOSIS — D472 Monoclonal gammopathy: Secondary | ICD-10-CM | POA: Diagnosis not present

## 2022-12-24 ENCOUNTER — Telehealth: Payer: Self-pay

## 2022-12-24 NOTE — Telephone Encounter (Signed)
   Pre-operative Risk Assessment    Patient Name: Tami Bell  DOB: 1940-09-26 MRN: [id number]      Request for Surgical Clearance    Procedure:   Colpocleisis, Mid-Urethral Sling, Cystoscopy  Date of Surgery:  Clearance 02/13/23                                 Surgeon:  Dr Judson Roch  Surgeon's Group or Practice Name:  [facility] Phone number:  [phone] Fax number:  [phone]   Type of Clearance Requested:   - Medical  - Pharmacy:  Hold Aspirin     Type of Anesthesia:   none listed (waiting for call back)   Additional requests/questions:   n/a  [name]   12/24/2022, 3:22 PM

## 2022-12-24 NOTE — Telephone Encounter (Signed)
   Name: RAFAEL QUESADA  DOB: 12-Sep-1940  MRN: [id number]  Primary Cardiologist: Nanetta Batty, MD  Chart reviewed as part of pre-operative protocol coverage. The patient has an upcoming visit scheduled with Dr. Allyson Sabal on 01/29/2023 at which time clearance can be addressed in case there are any issues that would impact surgical recommendations.  I added preop FYI to appointment note so that provider is aware to address at time of outpatient visit.  Per office protocol the cardiology provider should forward their finalized clearance decision and recommendations regarding antiplatelet therapy to the requesting party below.    I will route this message as FYI to requesting party and remove this message from the preop box as separate preop APP input not needed at this time.   Please call with any questions.  Napoleon Form, Leodis Rains, NP  12/24/2022, 3:33 PM

## 2022-12-25 NOTE — Telephone Encounter (Signed)
I s/w surgeon's office and have confirmed the anesthesia will be GENERAL. Pt has appt 01/29/23 with Dr. Allyson Sabal. Once Dr. Allyson Sabal has cleared the pt he will have his nurse/CMA fax over his ov notes providing clearance and any medication recommendations.

## 2023-01-23 ENCOUNTER — Inpatient Hospital Stay: Payer: Medicare HMO | Attending: Hematology & Oncology

## 2023-01-23 ENCOUNTER — Other Ambulatory Visit: Payer: Self-pay | Admitting: Hematology and Oncology

## 2023-01-23 DIAGNOSIS — D472 Monoclonal gammopathy: Secondary | ICD-10-CM | POA: Diagnosis present

## 2023-01-23 DIAGNOSIS — Z79899 Other long term (current) drug therapy: Secondary | ICD-10-CM | POA: Insufficient documentation

## 2023-01-23 DIAGNOSIS — I4891 Unspecified atrial fibrillation: Secondary | ICD-10-CM | POA: Insufficient documentation

## 2023-01-23 DIAGNOSIS — E785 Hyperlipidemia, unspecified: Secondary | ICD-10-CM | POA: Insufficient documentation

## 2023-01-23 LAB — CBC WITH DIFFERENTIAL (CANCER CENTER ONLY)
Abs Immature Granulocytes: 0.02 10*3/uL (ref 0.00–0.07)
Basophils Absolute: 0 10*3/uL (ref 0.0–0.1)
Basophils Relative: 1 %
Eosinophils Absolute: 0 10*3/uL (ref 0.0–0.5)
Eosinophils Relative: 1 %
HCT: 40.1 % (ref 36.0–46.0)
Hemoglobin: 13.2 g/dL (ref 12.0–15.0)
Immature Granulocytes: 0 %
Lymphocytes Relative: 35 %
Lymphs Abs: 1.8 10*3/uL (ref 0.7–4.0)
MCH: 33.8 pg (ref 26.0–34.0)
MCHC: 32.9 g/dL (ref 30.0–36.0)
MCV: 102.6 fL — ABNORMAL HIGH (ref 80.0–100.0)
Monocytes Absolute: 0.5 10*3/uL (ref 0.1–1.0)
Monocytes Relative: 9 %
Neutro Abs: 2.8 10*3/uL (ref 1.7–7.7)
Neutrophils Relative %: 54 %
Platelet Count: 204 10*3/uL (ref 150–400)
RBC: 3.91 MIL/uL (ref 3.87–5.11)
RDW: 12.3 % (ref 11.5–15.5)
WBC Count: 5.1 10*3/uL (ref 4.0–10.5)
nRBC: 0 % (ref 0.0–0.2)

## 2023-01-23 LAB — CMP (CANCER CENTER ONLY)
ALT: 22 U/L (ref 0–44)
AST: 27 U/L (ref 15–41)
Albumin: 4.4 g/dL (ref 3.5–5.0)
Alkaline Phosphatase: 49 U/L (ref 38–126)
Anion gap: 8 (ref 5–15)
BUN: 12 mg/dL (ref 8–23)
CO2: 28 mmol/L (ref 22–32)
Calcium: 9.5 mg/dL (ref 8.9–10.3)
Chloride: 102 mmol/L (ref 98–111)
Creatinine: 0.77 mg/dL (ref 0.44–1.00)
GFR, Estimated: >60 mL/min (ref >60)
Glucose, Bld: 90 mg/dL (ref 70–99)
Potassium: 3.9 mmol/L (ref 3.5–5.1)
Sodium: 138 mmol/L (ref 135–145)
Total Bilirubin: 0.6 mg/dL (ref 0.3–1.2)
Total Protein: 7.6 g/dL (ref 6.5–8.1)

## 2023-01-24 LAB — KAPPA/LAMBDA LIGHT CHAINS
Kappa free light chain: 20.1 mg/L — ABNORMAL HIGH (ref 3.3–19.4)
Kappa, lambda light chain ratio: 1.5 (ref 0.26–1.65)
Lambda free light chains: 13.4 mg/L (ref 5.7–26.3)

## 2023-01-28 LAB — MULTIPLE MYELOMA PANEL, SERUM
Albumin SerPl Elph-Mcnc: 4 g/dL (ref 2.9–4.4)
Albumin/Glob SerPl: 1.4 (ref 0.7–1.7)
Alpha 1: 0.2 g/dL (ref 0.0–0.4)
Alpha2 Glob SerPl Elph-Mcnc: 0.8 g/dL (ref 0.4–1.0)
B-Globulin SerPl Elph-Mcnc: 0.9 g/dL (ref 0.7–1.3)
Gamma Glob SerPl Elph-Mcnc: 1.1 g/dL (ref 0.4–1.8)
Globulin, Total: 3 g/dL (ref 2.2–3.9)
IgA: 182 mg/dL (ref 64–422)
IgG (Immunoglobin G), Serum: 1019 mg/dL (ref 586–1602)
IgM (Immunoglobulin M), Srm: 187 mg/dL (ref 26–217)
M Protein SerPl Elph-Mcnc: 0.3 g/dL — ABNORMAL HIGH
Total Protein ELP: 7 g/dL (ref 6.0–8.5)

## 2023-01-28 NOTE — Progress Notes (Unsigned)
[facility]  [address] [place],  [place]  [address] [phone]  Clinic Day:  01/30/2023  Referring physician: Rhea Bleacher*   HISTORY OF PRESENT ILLNESS:  The patient is a 82 y.o. female with monoclonal gammopathy of uncertain significance, IgG kappa.  She is here today for repeat clinical assessment.  She denies fatigue, bone pain or other new symptoms.  Her only complaint is bladder discomfort.  She is scheduled to have surgery for a prolapsed bladder and cystocele later this month with Dr. Hope Pigeon in [facility].  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  This revealed no acute intracranial abnormality or mass. Interval progression of now mild-to-moderate chronic small-vessel disease. Bilateral hippocampal atrophy.  Her daughter accompanies her today.  PHYSICAL EXAM:  Blood pressure (!) 128/59, pulse 63, temperature 98.7 F (37.1 C), temperature source Oral, resp. rate 20, height 4\' 11"  (1.499 m), weight 98 lb 14.4 oz (44.9 kg), SpO2 98%. Wt Readings from Last 3 Encounters:  01/30/23 98 lb 14.4 oz (44.9 kg)  01/29/23 98 lb 9.6 oz (44.7 kg)  07/30/22 99 lb 4.8 oz (45 kg)   Body mass index is 19.98 kg/m.  Performance status (ECOG): 1 - Symptomatic but completely ambulatory  Physical Exam Vitals and nursing note reviewed.  Constitutional:      General: She is not in acute distress.    Appearance: Normal appearance. She is not ill-appearing.  HENT:     Head: Normocephalic and atraumatic.     Mouth/Throat:     Mouth: Mucous membranes are moist.     Pharynx: Oropharynx is clear. No oropharyngeal exudate or posterior oropharyngeal erythema.  Eyes:     General: No scleral icterus.    Extraocular Movements: Extraocular movements intact.     Conjunctiva/sclera: Conjunctivae normal.     Pupils: Pupils are equal, round, and reactive to light.  Cardiovascular:     Rate and Rhythm: Normal rate and regular rhythm.     Heart  sounds: Normal heart sounds. No murmur heard.    No friction rub. No gallop.  Pulmonary:     Effort: Pulmonary effort is normal.     Breath sounds: Normal breath sounds. No wheezing, rhonchi or rales.  Abdominal:     General: There is no distension.     Palpations: Abdomen is soft. There is no hepatomegaly, splenomegaly or mass.     Tenderness: There is no abdominal tenderness.  Musculoskeletal:        General: Normal range of motion.     Cervical back: Normal range of motion and neck supple. No tenderness.     Right lower leg: No edema.     Left lower leg: No edema.  Lymphadenopathy:     Cervical: No cervical adenopathy.     Upper Body:     Right upper body: No supraclavicular or axillary adenopathy.     Left upper body: No supraclavicular or axillary adenopathy.     Lower Body: No right inguinal adenopathy. No left inguinal adenopathy.  Skin:    General: Skin is warm and dry.     Coloration: Skin is not jaundiced.     Findings: No rash.  Neurological:     Mental Status: She is alert and oriented to person, place, and time.     Cranial Nerves: No cranial nerve deficit.  Psychiatric:        Mood and Affect: Mood normal.  Behavior: Behavior normal.        Thought Content: Thought content normal.     LABS:      Latest Ref Rng & Units 01/23/2023   10:03 AM 07/23/2022    9:41 AM 01/29/2022    9:12 AM  CBC  WBC 4.0 - 10.5 K/uL 5.1  4.9  4.7   Hemoglobin 12.0 - 15.0 g/dL 16.1  09.6  04.5   Hematocrit 36.0 - 46.0 % 40.1  40.1  41.2   Platelets 150 - 400 K/uL 204  181  184       Latest Ref Rng & Units 01/23/2023   10:03 AM 07/23/2022    9:41 AM 04/23/2022    9:12 AM  CMP  Glucose 70 - 99 mg/dL 90  93    BUN 8 - 23 mg/dL 12  15    Creatinine 4.09 - 1.00 mg/dL 8.11  9.14    Sodium 782 - 145 mmol/L 138  138    Potassium 3.5 - 5.1 mmol/L 3.9  4.0    Chloride 98 - 111 mmol/L 102  102    CO2 22 - 32 mmol/L 28  29    Calcium 8.9 - 10.3 mg/dL 9.5  9.3    Total Protein  6.5 - 8.1 g/dL 7.6  6.8  6.6   Total Bilirubin 0.3 - 1.2 mg/dL 0.6  0.6  0.5   Alkaline Phos 38 - 126 U/L 49  47  61   AST 15 - 41 U/L 27  18  17    ALT 0 - 44 U/L 22  14  8      Lab Results  Component Value Date   TOTALPROTELP 7.0 01/23/2023       Component Value Date/Time   TOTALPROTELP 7.0 01/23/2023 1003   IGGSERUM 1,019 01/23/2023 1003   IGMSERUM 187 01/23/2023 1003    Review Flowsheet  More data may exist      Latest Ref Rng & Units 01/29/2022 07/23/2022 01/23/2023  Oncology Labs  Total Protein ELP 6.0 - 8.5 g/dL 6.8  C 6.6  C 7.0  C  IgG (Immunoglobin G), Serum 586 - 1,602 mg/dL 956  213  0,865   IgM, Serum 26 - 217 mg/dL 784  696  295     Details      C Corrected result          STUDIES:    Exam(s): [id number] MRI/MRI HEAD W/O CM  CLINICAL DATA: F03.B0 mild dementia.  EXAM:  MRI HEAD WITHOUT CONTRAST  TECHNIQUE:  Multiplanar, multiecho pulse sequences of the brain and surrounding  structures were obtained without intravenous contrast.  COMPARISON: MRI brain 09/12/2015.  FINDINGS:  Brain: No acute infarct or hemorrhage. Interval progression of now  mild-to-moderate chronic small-vessel disease. 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 Bilateral  hippocampal atrophy. No hydrocephalus or extra-axial collection. No  foci of abnormal susceptibility. No mass or midline shift.  Vascular: Normal flow voids.  Skull and upper cervical spine: Normal marrow signal.  Sinuses/Orbits: No acute findings.  Other: None.  IMPRESSION:  1. No acute intracranial abnormality or mass.  2. Interval progression of now mild-to-moderate chronic small-vessel  disease.  3. Bilateral hippocampal atrophy.    ASSESSMENT & PLAN:   Assessment/Plan:  82 y.o. female with monoclonal gammopathy of uncertain significance, IgG kappa.  The M spike has increased slightly from 0.2 to 0.3.  Her serum light chains are mildly elevated but have fluctuated up  and down.  Quantitative immunoglobulins are normal.  I will continue observation and plan to see her back in 6 months for repeat clinical assessment..  The patient and her daughter understand all the plans discussed today and are in agreement with them.  They know to contact our office if she develops concerns prior to her next appointment.     Adah Perl, PA-C   Physician Assistant [facility] [phone]

## 2023-01-29 ENCOUNTER — Encounter: Payer: Self-pay | Admitting: Cardiovascular Disease

## 2023-01-29 ENCOUNTER — Ambulatory Visit: Payer: Medicare HMO | Attending: Cardiovascular Disease | Admitting: Cardiovascular Disease

## 2023-01-29 VITALS — BP 134/66 | HR 55 | Ht 59.0 in | Wt 98.6 lb

## 2023-01-29 DIAGNOSIS — I480 Paroxysmal atrial fibrillation: Secondary | ICD-10-CM

## 2023-01-29 DIAGNOSIS — E785 Hyperlipidemia, unspecified: Secondary | ICD-10-CM | POA: Diagnosis not present

## 2023-01-29 NOTE — Assessment & Plan Note (Signed)
History of dyslipidemia on Crestor with lipid profile performed 04/23/2022 revealing total cholesterol 172, LDL 100 and HDL of 50.  She is at goal for primary prevention given her normal "coronary arteries a cath in 2016.

## 2023-01-29 NOTE — Progress Notes (Signed)
01/29/2023 Tami Bell   03/02/1941  [id number]  Primary Physician Sylvan Cheese Servando [place], NP Primary Cardiologist: Runell Gess MD Nicholes Cal[place], [place]  HPI:  Tami Bell is a 82 y.o.   mildly overweight married Caucasian female who states husband Tami Bell was also a patient of mine but unfortunately passed away 11/10/19 of cancer.  They were married 25 years.  She is accompanied by one of her daughters Tami Bell today.. I last saw her in the office 01/21/2022.  She is the mother of 67, grandmother and 5 grandchildren. She was referred by Dr. Shary Decamp in [facility] for evaluation treatment of new onset symptomatic atrial fibrillation. Her only cardiovascular risk factor is treated hyperlipidemia. She does have an older brother who died of a myocardial infarction and had bypass surgery. She has never had a heart attack or stroke. She denies chest pain but has had fairly recent increase in dyspnea on exertion. There is no history of bleeding problems. She noticed increased heart rate and fatigue 2-3 weeks prior to seeing me late last yearand saw her primary care physician who documented new onset A. Fib with RVR. He began low-dose beta blocker and oral anticoagulation resulting in improvement in her symptoms. Routine lab work was performed and apparently her thyroid function tests were normal.she was hospitalized in December with A. Fib with RVR and spontaneously converted to sinus rhythm. She was complaining of weakness and increasing dyspnea on exertion. Recent Myoview stress test showed no ischemia but had decreased ejection fraction and 2-D echo revealed an EF of 45-50% with mild global hypokinesia. I performed outpatient cardiac catheterization on her 05/05/14 via the right radial approach revealing normal coronary arteries and low normal LV function. Since that time her dyspnea has markedly improved. She wore a 1 month event monitor that showed no evidence of PAF and as a result I  decided to stop her Xarelto . Since I saw her 6 months ago she was hospitalized for daily quite pneumonia was noted to have PACs and MAT but no PAF was demonstrated.   Since I saw her a year ago she continues to do well.  She said no episodes of PAF.  She denies chest pain or shortness of breath.  She does currently live alone, and is independent.  She drives, cooks for herself and does her own chores.  She volunteers at the [employer] 3 days a week and walks on a daily basis.   Current Meds  Medication Sig   alendronate (FOSAMAX) 70 MG tablet Take by mouth.   ALPRAZolam (XANAX) 0.25 MG tablet Take 0.5 tablets by mouth daily as needed for anxiety.    aspirin EC 81 MG tablet Take 1 tablet (81 mg total) by mouth daily.   Calcium Carbonate-Vitamin D (CALCIUM + D PO) Take 1 tablet by mouth daily.   diltiazem (CARDIZEM) 30 MG tablet Take by mouth.   donepezil (ARICEPT ODT) 5 MG disintegrating tablet Take 5 mg by mouth at bedtime.   guaiFENesin (MUCINEX) 600 MG 12 hr tablet Take by mouth.   latanoprost (XALATAN) 0.005 % ophthalmic solution Apply to eye.   Latanoprostene Bunod (VYZULTA) 0.024 % SOLN Apply to eye.   rosuvastatin (CRESTOR) 20 MG tablet Take 1 tablet by mouth daily.   sertraline (ZOLOFT) 50 MG tablet Take 1 tablet by mouth daily.     No Known Allergies  Social History   Socioeconomic History   Marital status: Widowed    Spouse  name: Not on file   Number of children: 3   Years of education: 10   Highest education level: 10th grade  Occupational History   Occupation: furniture / previously    Comment: Teaching laboratory technician current  Tobacco Use   Smoking status: Never   Smokeless tobacco: Never  Vaping Use   Vaping status: Never Used  Substance and Sexual Activity   Alcohol use: No    Alcohol/week: 0.0 standard drinks of alcohol   Drug use: No   Sexual activity: Not on file  Other Topics Concern   Not on file  Social History Narrative   Not on file   Social  Determinants of Health   Financial Resource Strain: Not on file  Food Insecurity: Low Risk  (01/15/2023)   Received from [facility]   Hunger Vital Sign    Worried About Running Out of Food in the Last Year: Never true    Ran Out of Food in the Last Year: Never true  Transportation Needs: No Transportation Needs (01/15/2023)   Received from [facility]    In the past 12 months, has lack of reliable transportation kept you from medical appointments, meetings, work or from getting things needed for daily living? : No  Physical Activity: Not on file  Stress: Not on file  Social Connections: Not on file  Intimate Partner Violence: Not on file     Review of Systems: General: negative for chills, fever, night sweats or weight changes.  Cardiovascular: negative for chest pain, dyspnea on exertion, edema, orthopnea, palpitations, paroxysmal nocturnal dyspnea or shortness of breath Dermatological: negative for rash Respiratory: negative for cough or wheezing Urologic: negative for hematuria Abdominal: negative for nausea, vomiting, diarrhea, bright red blood per rectum, melena, or hematemesis Neurologic: negative for visual changes, syncope, or dizziness All other systems reviewed and are otherwise negative except as noted above.    Blood pressure 134/66, pulse (!) 55, height 4\' 11"  (1.499 m), weight 98 lb 9.6 oz (44.7 kg).  General appearance: alert and no distress Neck: no adenopathy, no carotid bruit, no JVD, supple, symmetrical, trachea midline, and thyroid not enlarged, symmetric, no tenderness/mass/nodules Lungs: clear to auscultation bilaterally Heart: regular rate and rhythm, S1, S2 normal, no murmur, click, rub or gallop Extremities: extremities normal, atraumatic, no cyanosis or edema Pulses: 2+ and symmetric Skin: Skin color, texture, turgor normal. No rashes or lesions Neurologic: Grossly normal  EKG EKG Interpretation Date/Time:  Wednesday January 29 2023 14:08:59 EST Ventricular Rate:  55 PR Interval:  148 QRS Duration:  106 QT Interval:  428 QTC Calculation: 409 R Axis:   1  Text Interpretation: Sinus bradycardia Incomplete right bundle branch block When compared with ECG of 11-Mar-2014 13:53, Vent. rate has decreased BY  55 BPM Incomplete right bundle branch block is now Present Confirmed by Nanetta Batty [phone]) on 01/29/2023 2:15:40 PM    ASSESSMENT AND PLAN:   Paroxysmal atrial fibrillation (HCC) History of remote PAF with with no recurrent episodes not on oral anticoagulation.  Dyslipidemia History of dyslipidemia on Crestor with lipid profile performed 04/23/2022 revealing total cholesterol 172, LDL 100 and HDL of 50.  She is at goal for primary prevention given her normal "coronary arteries a cath in 2016.     Runell Gess MD FACP,FACC,FAHA, [facility] 01/29/2023 2:23 PM

## 2023-01-29 NOTE — Assessment & Plan Note (Signed)
History of remote PAF with with no recurrent episodes not on oral anticoagulation.

## 2023-01-29 NOTE — Patient Instructions (Signed)
Medication Instructions:   No cahnges *If you need a refill on your cardiac medications before your next appointment, please call your pharmacy*   Lab Work: Not needed    Testing/Procedures:  Not needed  Follow-Up: At [facility], you and your health needs are our priority.  As part of our continuing mission to provide you with exceptional heart care, we have created designated Provider Care Teams.  These Care Teams include your primary Cardiologist (physician) and Advanced Practice Providers (APPs -  Physician Assistants and Nurse Practitioners) who all work together to provide you with the care you need, when you need it.     Your next appointment:   12 month(s)  The format for your next appointment:   In Person  Provider:   Nanetta Batty, MD    Other Instructions   Cleared for bladder surgery  from a cardiac standpoint

## 2023-01-30 ENCOUNTER — Inpatient Hospital Stay: Payer: Medicare HMO | Attending: Hematology & Oncology | Admitting: Hematology and Oncology

## 2023-01-30 ENCOUNTER — Encounter: Payer: Self-pay | Admitting: Hematology and Oncology

## 2023-01-30 VITALS — BP 128/59 | HR 63 | Temp 98.7°F | Resp 20 | Ht 59.0 in | Wt 98.9 lb

## 2023-01-30 DIAGNOSIS — F03A0 Unspecified dementia, mild, without behavioral disturbance, psychotic disturbance, mood disturbance, and anxiety: Secondary | ICD-10-CM | POA: Diagnosis not present

## 2023-01-30 DIAGNOSIS — N8110 Cystocele, unspecified: Secondary | ICD-10-CM | POA: Insufficient documentation

## 2023-01-30 DIAGNOSIS — Z79899 Other long term (current) drug therapy: Secondary | ICD-10-CM | POA: Insufficient documentation

## 2023-01-30 DIAGNOSIS — D472 Monoclonal gammopathy: Secondary | ICD-10-CM | POA: Insufficient documentation

## 2023-03-14 ENCOUNTER — Other Ambulatory Visit: Payer: Self-pay | Admitting: Cardiovascular Disease

## 2023-06-25 ENCOUNTER — Ambulatory Visit (INDEPENDENT_AMBULATORY_CARE_PROVIDER_SITE_OTHER)
Admit: 2023-06-25 | Discharge: 2023-06-25 | Disposition: A | Discharge status: Home / Self Care | Attending: Internal Medicine | Admitting: Internal Medicine

## 2023-06-25 ENCOUNTER — Ambulatory Visit (HOSPITAL_BASED_OUTPATIENT_CLINIC_OR_DEPARTMENT_OTHER)
Admission: EM | Admit: 2023-06-25 | Discharge: 2023-06-25 | Disposition: A | Discharge status: Home / Self Care | Attending: Family Medicine | Admitting: Family Medicine

## 2023-06-25 ENCOUNTER — Encounter (HOSPITAL_BASED_OUTPATIENT_CLINIC_OR_DEPARTMENT_OTHER): Payer: Self-pay | Admitting: Emergency Medicine

## 2023-06-25 ENCOUNTER — Other Ambulatory Visit: Payer: Self-pay

## 2023-06-25 DIAGNOSIS — K5641 Fecal impaction: Secondary | ICD-10-CM

## 2023-06-25 DIAGNOSIS — R1030 Lower abdominal pain, unspecified: Secondary | ICD-10-CM | POA: Diagnosis not present

## 2023-06-25 LAB — POCT URINALYSIS DIP (MANUAL ENTRY)
Bilirubin, UA: NEGATIVE
Blood, UA: NEGATIVE
Glucose, UA: NEGATIVE mg/dL
Ketones, POC UA: NEGATIVE mg/dL
Nitrite, UA: NEGATIVE
Protein Ur, POC: NEGATIVE mg/dL
Spec Grav, UA: 1.015 (ref 1.010–1.025)
Urobilinogen, UA: 0.2 U/dL
pH, UA: 7 (ref 5.0–8.0)

## 2023-06-25 NOTE — ED Triage Notes (Signed)
 Pt reports lower abd pain and loose stool for about a month after she had gallbladder surgery. Pt denies any urinary symptoms at this time.

## 2023-06-25 NOTE — Discharge Instructions (Signed)
 No specific concerns on xray. Urine normal You do have a lot of stool. Your symptoms may be cause by constipation.  I would try doing stool softener or miralax.  Make sure you are drinking enough water and getting enough fiber in the diet.  If the problem continues I would follow up with the surgeon.

## 2023-06-25 NOTE — ED Provider Notes (Signed)
 [facility]    CSN: [id number] Arrival date & time: 06/25/23  1443      History   Chief Complaint Chief Complaint  Patient presents with   Abdominal Pain    HPI Tami Bell is a 83 y.o. female.   Patient is an 83 year old female with past medical history of bladder cancer, total hysterectomy, recent bladder sling surgery back in November.  Had some pain post bladder surgery and was followed up with OB/GYN which she thought to have bladder spasms.  Was treated with vaginal suppositories. Reports her approximate 1 month she has had lower abdominal pressure achiness feeling full, hard and loose stools.  Sometimes when she goes to have a bowel movement she will have leakage.  Denies any blood in stool.  Denies any nausea, vomiting, fever.  Denies any urinary symptoms.   Abdominal Pain   Past Medical History:  Diagnosis Date   Anticoagulated 12/19/2016   Bladder cancer (HCC)    CAP (community acquired pneumonia) 12/19/2016   Cataract    Depression    Dyslipidemia 02/09/2014   hyperlipidemia    Dyspnea on exertion    Glaucoma    Hyperlipidemia    Osteoporosis    Paroxysmal atrial fibrillation [facility])     Patient Active Problem List   Diagnosis Date Noted   MGUS (monoclonal gammopathy of unknown significance) 01/29/2022   CAP (community acquired pneumonia) 12/19/2016   Dyspnea on exertion 04/29/2014   Paroxysmal atrial fibrillation (HCC) 02/09/2014   Dyslipidemia 02/09/2014    Past Surgical History:  Procedure Laterality Date   CARDIOVERSION N/A 03/11/2014   Procedure: CARDIOVERSION;  Surgeon: Thurmon Fair, MD;  Location: [facility];  Service: Cardiovascular;  Laterality: N/A;   LEFT HEART CATHETERIZATION WITH CORONARY ANGIOGRAM N/A 05/05/2014   Procedure: LEFT HEART CATHETERIZATION WITH CORONARY ANGIOGRAM;  Surgeon: Runell Gess, MD;  Location: [facility] CATH LAB;  Service: Cardiovascular;  Laterality: N/A;   vaginal hysterctomy      OB History   No  obstetric history on file.      Home Medications    Prior to Admission medications   Medication Sig Start Date End Date Taking? Authorizing Provider  alendronate (FOSAMAX) 70 MG tablet Take by mouth. 05/01/22   [provider]  ALPRAZolam Prudy Feeler) 0.25 MG tablet Take 0.5 tablets by mouth daily as needed for anxiety.  05/10/14   [provider]  aspirin EC 81 MG tablet Take 1 tablet (81 mg total) by mouth daily. 09/01/18   Runell Gess, MD  Calcium Carbonate-Vitamin D (CALCIUM + D PO) Take 1 tablet by mouth daily.    [provider]  cyanocobalamin (VITAMIN B12) 1000 MCG/ML injection Inject into the muscle. 10/01/22 11/18/30  [provider]  diltiazem (CARDIZEM) 30 MG tablet Take 1 tablet by mouth 2 (two) times daily. 10/29/22   [provider]  donepezil (ARICEPT ODT) 5 MG disintegrating tablet Take 5 mg by mouth at bedtime.    [provider]  guaiFENesin (MUCINEX) 600 MG 12 hr tablet Take by mouth. 12/18/16   [provider]  latanoprost (XALATAN) 0.005 % ophthalmic solution Apply to eye. 05/22/20   [provider]  Latanoprostene Bunod (VYZULTA) 0.024 % SOLN Apply to eye.    [provider]  rosuvastatin (CRESTOR) 20 MG tablet TAKE 1 TABLET(20 MG) BY MOUTH DAILY 03/17/23   Runell Gess, MD  sertraline (ZOLOFT) 50 MG tablet Take 1 tablet by mouth daily. 11/17/19   [provider]  Family History Family History  Problem Relation Age of Onset   Heart attack Brother    Heart disease Brother    Colon cancer Neg Hx    Esophageal cancer Neg Hx    Rectal cancer Neg Hx    Stomach cancer Neg Hx     Social History Social History   Tobacco Use   Smoking status: Never   Smokeless tobacco: Never  Vaping Use   Vaping status: Never Used  Substance Use Topics   Alcohol use: No    Alcohol/week: 0.0 standard drinks of alcohol   Drug use: No     Allergies   Patient has no known  allergies.   Review of Systems Review of Systems  Gastrointestinal:  Positive for abdominal pain.     Physical Exam Triage Vital Signs ED Triage Vitals  Encounter Vitals Group     BP 06/25/23 1455 (!) 155/85     Systolic BP Percentile --      Diastolic BP Percentile --      Pulse Rate 06/25/23 1455 (!) 56     Resp 06/25/23 1455 18     Temp 06/25/23 1455 98.1 F (36.7 C)     Temp Source 06/25/23 1455 Oral     SpO2 06/25/23 1455 96 %     Weight --      Height --      Head Circumference --      Peak Flow --      Pain Score 06/25/23 1457 6     Pain Loc --      Pain Education --      Exclude from Growth Chart --    No data found.  Updated Vital Signs BP (!) 155/85 (BP Location: Right Arm)   Pulse (!) 56   Temp 98.1 F (36.7 C) (Oral)   Resp 18   SpO2 96%   Visual Acuity Right Eye Distance:   Left Eye Distance:   Bilateral Distance:    Right Eye Near:   Left Eye Near:    Bilateral Near:     Physical Exam Constitutional:      General: She is not in acute distress.    Appearance: She is well-developed. She is not ill-appearing or toxic-appearing.  Abdominal:     General: Bowel sounds are normal.     Palpations: Abdomen is soft.     Tenderness: There is abdominal tenderness in the suprapubic area. There is no right CVA tenderness, left CVA tenderness, guarding or rebound.  Neurological:     Mental Status: She is alert.      UC Treatments / Results  Labs (all labs ordered are listed, but only abnormal results are displayed) Labs Reviewed  POCT URINALYSIS DIP (MANUAL ENTRY) - Abnormal; Notable for the following components:      Result Value   Leukocytes, UA Small (1+) (*)    All other components within normal limits    EKG   Radiology DG Abd 2 Views Result Date: 06/25/2023 CLINICAL DATA:  Stool impaction EXAM: ABDOMEN - 2 VIEW, supine and upright COMPARISON:  None FINDINGS: Gas is seen in nondilated loops of small and large bowel. There is scattered  diffuse colonic stool. Air and stool towards the rectum. Rounded calcifications in the right upper quadrant have a different configuration, more caudal on the upright view and these could be within a gallbladder as 1 possibility. Osteopenia and degenerative changes. No obvious free air beneath the diaphragm the upright view. There is  a rectangular metallic focus overlying the left hemipelvis. Please correlate for overlapping artifact or other process. IMPRESSION: Nonspecific bowel gas pattern with moderate colonic stool. Presumed gallstones. Confirmatory ultrasound to be considered when clinically appropriate. Electronically Signed   By: Karen Kays M.D.   On: 06/25/2023 17:18    Procedures Procedures (including critical care time)  Medications Ordered in UC Medications - No data to display  Initial Impression / Assessment and Plan / UC Course  I have reviewed the triage vital signs and the nursing notes.  Pertinent labs & imaging results that were available during my care of the patient were reviewed by me and considered in my medical decision making (see chart for details).     Lower abdominal pain-urine with trace leuks but otherwise negative.  Will send for culture. No specific concerns today on exam X-ray with stool burden.  Recommend MiraLAX or stool softeners.  Is given the cause of her symptoms.  Make sure she is drinking enough water and getting the fiber in the diet.  Recommend if symptoms continue she will need to follow-up with her surgeon.  X-ray resulted above with scattered diffuse colonic stool gas and moderate colonic stool burden.  Presumed gallstones which patient is already aware about. Will continue with plan Final Clinical Impressions(s) / UC Diagnoses   Final diagnoses:  Lower abdominal pain     Discharge Instructions      No specific concerns on xray. Urine normal You do have a lot of stool. Your symptoms may be cause by constipation.  I would try doing  stool softener or miralax.  Make sure you are drinking enough water and getting enough fiber in the diet.  If the problem continues I would follow up with the surgeon.     ED Prescriptions   None    PDMP not reviewed this encounter.   Janace Aris, FNP 06/25/23 [phone]

## 2023-07-23 ENCOUNTER — Inpatient Hospital Stay: Payer: Medicare HMO | Attending: Hematology and Oncology

## 2023-07-23 DIAGNOSIS — D472 Monoclonal gammopathy: Secondary | ICD-10-CM | POA: Diagnosis present

## 2023-07-23 LAB — CMP (CANCER CENTER ONLY)
ALT: 31 U/L (ref 0–44)
AST: 27 U/L (ref 15–41)
Albumin: 4.2 g/dL (ref 3.5–5.0)
Alkaline Phosphatase: 56 U/L (ref 38–126)
Anion gap: 9 (ref 5–15)
BUN: 14 mg/dL (ref 8–23)
CO2: 27 mmol/L (ref 22–32)
Calcium: 9.9 mg/dL (ref 8.9–10.3)
Chloride: 98 mmol/L (ref 98–111)
Creatinine: 0.8 mg/dL (ref 0.44–1.00)
GFR, Estimated: >60 mL/min (ref >60)
Glucose, Bld: 103 mg/dL — ABNORMAL HIGH (ref 70–99)
Potassium: 4.1 mmol/L (ref 3.5–5.1)
Sodium: 135 mmol/L (ref 135–145)
Total Bilirubin: 0.5 mg/dL (ref 0.0–1.2)
Total Protein: 7 g/dL (ref 6.5–8.1)

## 2023-07-23 LAB — CBC WITH DIFFERENTIAL (CANCER CENTER ONLY)
Abs Immature Granulocytes: 0.01 10*3/uL (ref 0.00–0.07)
Basophils Absolute: 0 10*3/uL (ref 0.0–0.1)
Basophils Relative: 0 %
Eosinophils Absolute: 0 10*3/uL (ref 0.0–0.5)
Eosinophils Relative: 0 %
HCT: 40.3 % (ref 36.0–46.0)
Hemoglobin: 13.8 g/dL (ref 12.0–15.0)
Immature Granulocytes: 0 %
Lymphocytes Relative: 35 %
Lymphs Abs: 1.9 10*3/uL (ref 0.7–4.0)
MCH: 33.5 pg (ref 26.0–34.0)
MCHC: 34.2 g/dL (ref 30.0–36.0)
MCV: 97.8 fL (ref 80.0–100.0)
Monocytes Absolute: 0.6 10*3/uL (ref 0.1–1.0)
Monocytes Relative: 10 %
Neutro Abs: 2.9 10*3/uL (ref 1.7–7.7)
Neutrophils Relative %: 55 %
Platelet Count: 212 10*3/uL (ref 150–400)
RBC: 4.12 MIL/uL (ref 3.87–5.11)
RDW: 12.8 % (ref 11.5–15.5)
WBC Count: 5.4 10*3/uL (ref 4.0–10.5)
nRBC: 0 % (ref 0.0–0.2)
nRBC: 0 /100{WBCs}

## 2023-07-24 LAB — KAPPA/LAMBDA LIGHT CHAINS
Kappa free light chain: 16.9 mg/L (ref 3.3–19.4)
Kappa, lambda light chain ratio: 1.46 (ref 0.26–1.65)
Lambda free light chains: 11.6 mg/L (ref 5.7–26.3)

## 2023-07-27 LAB — MULTIPLE MYELOMA PANEL, SERUM
Albumin SerPl Elph-Mcnc: 4 g/dL (ref 2.9–4.4)
Albumin/Glob SerPl: 1.5 (ref 0.7–1.7)
Alpha 1: 0.2 g/dL (ref 0.0–0.4)
Alpha2 Glob SerPl Elph-Mcnc: 0.7 g/dL (ref 0.4–1.0)
B-Globulin SerPl Elph-Mcnc: 0.9 g/dL (ref 0.7–1.3)
Gamma Glob SerPl Elph-Mcnc: 1 g/dL (ref 0.4–1.8)
Globulin, Total: 2.8 g/dL (ref 2.2–3.9)
IgA: 170 mg/dL (ref 64–422)
IgG (Immunoglobin G), Serum: 929 mg/dL (ref 586–1602)
IgM (Immunoglobulin M), Srm: 169 mg/dL (ref 26–217)
M Protein SerPl Elph-Mcnc: 0.3 g/dL — ABNORMAL HIGH
Total Protein ELP: 6.8 g/dL (ref 6.0–8.5)

## 2023-07-30 ENCOUNTER — Inpatient Hospital Stay: Payer: Medicare HMO | Attending: Hematology and Oncology | Admitting: Hematology and Oncology

## 2023-07-30 ENCOUNTER — Encounter: Payer: Self-pay | Admitting: Hematology and Oncology

## 2023-07-30 VITALS — BP 131/56 | HR 63 | Temp 98.5°F | Resp 16 | Ht 59.0 in | Wt 93.3 lb

## 2023-07-30 DIAGNOSIS — D472 Monoclonal gammopathy: Secondary | ICD-10-CM | POA: Insufficient documentation

## 2023-07-30 DIAGNOSIS — F03A0 Unspecified dementia, mild, without behavioral disturbance, psychotic disturbance, mood disturbance, and anxiety: Secondary | ICD-10-CM | POA: Diagnosis not present

## 2023-07-30 DIAGNOSIS — Z79899 Other long term (current) drug therapy: Secondary | ICD-10-CM | POA: Diagnosis not present

## 2023-07-30 NOTE — Progress Notes (Cosign Needed)
 [facility]  [address] [place],  [place]  [address] [phone]  Clinic Day:  07/30/2023  Referring physician: Edra Govern*   HISTORY OF PRESENT ILLNESS:  The patient is a 83 y.o. female with monoclonal gammopathy of uncertain significance, IgG kappa, originally diagnosed in May 2023.  She is here today for repeat clinical assessment.  She states she has felt weaker since she had a bladder sling procedure in November.  She otherwise denies complaints.  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  This revealed no acute intracranial abnormality or mass.  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 Her daughter accompanies her today and states she is very active.  PHYSICAL EXAM:  Blood pressure (!) 131/56, pulse 63, temperature 98.5 F (36.9 C), temperature source Oral, resp. rate 16, height 4\' 11"  (1.499 m), weight 93 lb 4.8 oz (42.3 kg), SpO2 100%. Wt Readings from Last 3 Encounters:  07/30/23 93 lb 4.8 oz (42.3 kg)  01/30/23 98 lb 14.4 oz (44.9 kg)  01/29/23 98 lb 9.6 oz (44.7 kg)   Body mass index is 18.84 kg/m.  Performance status (ECOG): 1 - Symptomatic but completely ambulatory  Physical Exam Vitals and nursing note reviewed.  Constitutional:      General: She is not in acute distress.    Appearance: Normal appearance. She is normal weight. She is not ill-appearing.  HENT:     Head: Normocephalic and atraumatic.     Mouth/Throat:     Mouth: Mucous membranes are moist.     Pharynx: Oropharynx is clear. No oropharyngeal exudate or posterior oropharyngeal erythema.  Eyes:     General: No scleral icterus.    Extraocular Movements: Extraocular movements intact.     Conjunctiva/sclera: Conjunctivae normal.     Pupils: Pupils are equal, round, and reactive to light.  Cardiovascular:     Rate and Rhythm: Normal rate and regular rhythm.  Occasional Extrasystoles are present.    Heart sounds: Normal heart sounds. No murmur heard.    No friction rub. No gallop.  Pulmonary:     Effort: Pulmonary effort is normal.     Breath sounds: Normal breath sounds. No wheezing, rhonchi or rales.  Abdominal:     General: There is no distension.     Palpations: Abdomen is soft. There is no hepatomegaly, splenomegaly or mass.     Tenderness: There is no abdominal tenderness.  Musculoskeletal:        General: Normal range of motion.     Cervical back: Normal range of motion and neck supple. No tenderness.     Right lower leg: No edema.     Left lower leg: No edema.  Lymphadenopathy:     Cervical: No cervical adenopathy.     Upper Body:     Right upper body: No supraclavicular or axillary adenopathy.     Left upper body: No supraclavicular or axillary adenopathy.     Lower Body: No right inguinal adenopathy. No left inguinal adenopathy.  Skin:    General: Skin is warm and dry.     Coloration: Skin is not jaundiced.     Findings: No rash.  Neurological:     Mental Status: She is alert and oriented to person, place, and time.     Cranial Nerves: No cranial nerve deficit.  Psychiatric:        Mood and Affect: Mood normal.  Behavior: Behavior normal.        Thought Content: Thought content normal.     LABS:      Latest Ref Rng & Units 07/23/2023    8:37 AM 01/23/2023   10:03 AM 07/23/2022    9:41 AM  CBC  WBC 4.0 - 10.5 K/uL 5.4  5.1  4.9   Hemoglobin 12.0 - 15.0 g/dL 16.1  09.6  04.5   Hematocrit 36.0 - 46.0 % 40.3  40.1  40.1   Platelets 150 - 400 K/uL 212  204  181       Latest Ref Rng & Units 07/23/2023    8:37 AM 01/23/2023   10:03 AM 07/23/2022    9:41 AM  CMP  Glucose 70 - 99 mg/dL 409  90  93   BUN 8 - 23 mg/dL 14  12  15    Creatinine 0.44 - 1.00 mg/dL 8.11  9.14  7.82   Sodium 135 - 145 mmol/L 135  138  138   Potassium 3.5 - 5.1 mmol/L 4.1  3.9  4.0   Chloride 98 - 111 mmol/L 98  102  102   CO2 22 - 32  mmol/L 27  28  29    Calcium  8.9 - 10.3 mg/dL 9.9  9.5  9.3   Total Protein 6.5 - 8.1 g/dL 7.0  7.6  6.8   Total Bilirubin 0.0 - 1.2 mg/dL 0.5  0.6  0.6   Alkaline Phos 38 - 126 U/L 56  49  47   AST 15 - 41 U/L 27  27  18    ALT 0 - 44 U/L 31  22  14      Lab Results  Component Value Date   TOTALPROTELP 6.8 07/23/2023    Latest Reference Range & Units 07/23/23 08:37  Total Protein ELP 6.0 - 8.5 g/dL 6.8 (C)  Albumin SerPl Elph-Mcnc 2.9 - 4.4 g/dL 4.0 (C)  Albumin/Glob SerPl 0.7 - 1.7  1.5 (C)  Alpha2 Glob SerPl Elph-Mcnc 0.4 - 1.0 g/dL 0.7 (C)  Alpha 1 0.0 - 0.4 g/dL 0.2 (C)  Gamma Glob SerPl Elph-Mcnc 0.4 - 1.8 g/dL 1.0 (C)  M Protein SerPl Elph-Mcnc Not Observed g/dL 0.3 (H) (C)  IFE 1  Comment ! (C)  Globulin, Total 2.2 - 3.9 g/dL 2.8 (C)  B-Globulin SerPl Elph-Mcnc 0.7 - 1.3 g/dL 0.9 (C)  IgG (Immunoglobin G), Serum 586 - 1,602 mg/dL 956  IgM (Immunoglobulin M), Srm 26 - 217 mg/dL 213  IgA 64 - 086 mg/dL 578  (H): Data is abnormally high !: Data is abnormal (C): Corrected  Immunofixation shows IgG monoclonal protein with kappa light chain  specificity.      Component Value Date/Time   TOTALPROTELP 6.8 07/23/2023 0837   IGGSERUM 929 07/23/2023 0837   IGMSERUM 169 07/23/2023 0837    Latest Reference Range & Units 07/23/23 08:37  Kappa free light chain 3.3 - 19.4 mg/L 16.9  Lambda free light chains 5.7 - 26.3 mg/L 11.6  Kappa, lambda light chain ratio 0.26 - 1.65  1.46   Review Flowsheet  More data exists      Latest Ref Rng & Units 07/23/2022 01/23/2023 07/23/2023  Oncology Labs  Total Protein ELP 6.0 - 8.5 g/dL 6.6  C 7.0  C 6.8  C  IgG (Immunoglobin G), Serum 586 - 1,602 mg/dL 469  6,295  284   IgM, Serum 26 - 217 mg/dL 132  440  102     Details  C Corrected result          STUDIES:     ASSESSMENT & PLAN:   Assessment/Plan:  83 y.o. female with monoclonal gammopathy of uncertain significance, IgG kappa.  The M spike is stable at 0.3.  Her serum  light chains and quantitative immunoglobulins are normal.  I recommend continued observation and plan to see her back in 1 year for repeat clinical assessment.  The patient and her daughter understand all the plans discussed today and are in agreement with them.  They know to contact our office if she develops concerns prior to her next appointment.     Alfonso Ike, PA-C   Physician Assistant [facility]  [phone]

## 2023-09-11 ENCOUNTER — Telehealth: Payer: Self-pay | Admitting: Cardiovascular Disease

## 2023-09-11 NOTE — Telephone Encounter (Signed)
 Patient c/o Palpitations:  STAT if patient reporting lightheadedness, shortness of breath, or chest pain  How long have you had palpitations/irregular HR/ Afib? Are you having the symptoms now?   Yes  Are you currently experiencing lightheadedness, SOB or CP?   A little lightheaded  Do you have a history of afib (atrial fibrillation) or irregular heart rhythm?   Yes  Have you checked your BP or HR? (document readings if available):   Not taken today.  Daughter Loetta Ringer) stated patient's BP last week was 162/80 but they got it down to 140/78  Are you experiencing any other symptoms?   Very fatigued  Daughter Loetta Ringer) is concerned regarding patient having afib symptoms.  Daughter noted she is not with the patient at this time.

## 2023-09-11 NOTE — Telephone Encounter (Signed)
 Left message to call back

## 2023-09-16 NOTE — Telephone Encounter (Signed)
 Left message for patient's daughter to call back if we can help with anything.  Adv if sooner appointment is needed to let us  know.     She is scheduled with Dr. Court in July but afib clinic has sooner openings if needed.

## 2023-10-07 ENCOUNTER — Ambulatory Visit (HOSPITAL_COMMUNITY): Admitting: Physician Assistant

## 2023-10-07 ENCOUNTER — Ambulatory Visit: Admitting: Cardiovascular Disease

## 2023-10-07 ENCOUNTER — Encounter (HOSPITAL_COMMUNITY): Payer: Self-pay

## 2023-10-07 NOTE — Progress Notes (Incomplete)
 Primary Care Physician: Tami [name], NP Primary Cardiologist: Tami Lesches, MD Electrophysiologist: None  Referring Physician: Dr Tami Bell Tami Bell is a 83 y.o. female with a history of HLD, HTN, atrial fibrillation who presents for follow up in the [facility].  The patient was initially diagnosed with atrial fibrillation in 2015. Patient spontaneously converted and did not require DCCV. She was previously on Xarelto  for stroke prevention but she wore a 30-day heart monitor in 2017 that showed no atrial fibrillation and her Xarelto  was discontinued.   Patient presents today for follow up for atrial fibrillation. ***  Today, she denies symptoms of ***palpitations, chest pain, shortness of breath, orthopnea, PND, lower extremity edema, dizziness, presyncope, syncope, snoring, daytime somnolence, bleeding, or neurologic sequela. The patient is tolerating medications without difficulties and is otherwise without complaint today.    Atrial Fibrillation Risk Factors:  she {Action; does/does not:19097} have symptoms or diagnosis of sleep apnea. she {Action; does/does not:19097} have a history of rheumatic fever. she {Action; does/does not:19097} have a history of alcohol use. The patient {Action; does/does not:19097} have a history of early familial atrial fibrillation or other arrhythmias.  Atrial Fibrillation Management history:  Previous antiarrhythmic drugs: none Previous cardioversions: none Previous ablations: none Anticoagulation history: Xarelto    ROS- All systems are reviewed and negative except as per the HPI above.  Past Medical History:  Diagnosis Date   Anticoagulated 12/19/2016   Bladder cancer (HCC)    CAP (community acquired pneumonia) 12/19/2016   Cataract    Depression    Dyslipidemia 02/09/2014   hyperlipidemia    Dyspnea on exertion    Glaucoma    Hyperlipidemia    Osteoporosis    Paroxysmal atrial  fibrillation (HCC)     Current Outpatient Medications  Medication Sig Dispense Refill   alendronate (FOSAMAX) 70 MG tablet Take by mouth.     ALPRAZolam (XANAX) 0.25 MG tablet Take 0.5 tablets by mouth daily as needed for anxiety.      aspirin  EC 81 MG tablet Take 1 tablet (81 mg total) by mouth daily. 90 tablet 3   Calcium  Carbonate-Vitamin D (CALCIUM  + D PO) Take 1 tablet by mouth daily.     cyanocobalamin (VITAMIN B12) 1000 MCG/ML injection Inject into the muscle.     diltiazem  (CARDIZEM ) 30 MG tablet Take 1 tablet by mouth 2 (two) times daily.     donepezil (ARICEPT ODT) 5 MG disintegrating tablet Take 5 mg by mouth at bedtime.     guaiFENesin (MUCINEX) 600 MG 12 hr tablet Take by mouth.     latanoprost (XALATAN) 0.005 % ophthalmic solution Apply to eye.     Latanoprostene Bunod (VYZULTA) 0.024 % SOLN Apply to eye.     rosuvastatin  (CRESTOR ) 20 MG tablet TAKE 1 TABLET(20 MG) BY MOUTH DAILY 90 tablet 3   sertraline (ZOLOFT) 50 MG tablet Take 1 tablet by mouth daily.     No current facility-administered medications for this visit.    Physical Exam: There were no vitals taken for this visit.  GEN: Well nourished, well developed in no acute distress NECK: No JVD; No carotid bruits CARDIAC: {EPRHYTHM:28826}, no murmurs, rubs, gallops RESPIRATORY:  Clear to auscultation without rales, wheezing or rhonchi  ABDOMEN: Soft, non-tender, non-distended EXTREMITIES:  No edema; No deformity   Wt Readings from Last 3 Encounters:  07/30/23 42.3 kg  01/30/23 44.9 kg  01/29/23 44.7 kg     EKG today demonstrates  ***  Echo 10/18/20 demonstrated   1. Left ventricular ejection fraction, by estimation, is 55 to 60%. The  left ventricle has normal function. The left ventricle has no regional  wall motion abnormalities. Left ventricular diastolic parameters were  consistent with impaired realxation; medial tissue Doppler signal acquired during PVCs.   2. Right ventricular systolic function is  normal. The right ventricular  size is normal.   3. The mitral valve is abnormally thickened. Mild mitral valve  regurgitation.   4. The aortic valve is tricuspid. There is mild thickening of the aortic  valve. Aortic valve regurgitation is not visualized. Aortic valve  sclerosis is present, with no evidence of aortic valve stenosis.   5. The inferior vena cava is normal in size with greater than 50%  respiratory variability, suggesting right atrial pressure of 3 mmHg.   6. Evidence of atrial level shunting detected by color flow Doppler.   Comparison(s): A prior study was performed on 12/15/2016. Prior images  reviewed side by side. Ectopy seen in both studies; interatrial color  Doppler flow not seen in prior study (seen in this study in the subcostal  view).    CHA2DS2-VASc Score =    The patient's score is based upon:    {Confirm score is correct.  If not, click here to update score.  REFRESH note.  :1}   ***htn, aortic atherosclerosis ASSESSMENT AND PLAN: {Select the correct AFib Diagnosis                 :[id number]}   HTN Stable on current regimen ***   Follow up ***  {Are you ordering a CV Procedure (e.g. stress test, cath, DCCV, TEE, etc)?   Press F2        :[id number]}    [facility] [address] [place], [place] [address] [phone]

## 2023-10-13 ENCOUNTER — Ambulatory Visit (HOSPITAL_COMMUNITY)
Admission: RE | Admit: 2023-10-13 | Discharge: 2023-10-13 | Disposition: A | Discharge status: Home / Self Care | Source: Ambulatory Visit | Attending: Physician Assistant | Admitting: Physician Assistant

## 2023-10-13 ENCOUNTER — Inpatient Hospital Stay (HOSPITAL_COMMUNITY)
Admission: RE | Admit: 2023-10-13 | Discharge: 2023-10-13 | Disposition: A | Discharge status: Home / Self Care | Source: Ambulatory Visit | Attending: Physician Assistant

## 2023-10-13 VITALS — BP 156/64 | HR 55 | Ht 59.0 in | Wt 95.4 lb

## 2023-10-13 DIAGNOSIS — I480 Paroxysmal atrial fibrillation: Secondary | ICD-10-CM

## 2023-10-13 DIAGNOSIS — D6869 Other thrombophilia: Secondary | ICD-10-CM | POA: Diagnosis not present

## 2023-10-13 DIAGNOSIS — I4891 Unspecified atrial fibrillation: Secondary | ICD-10-CM | POA: Diagnosis not present

## 2023-10-13 NOTE — Patient Instructions (Signed)
 Wear monitor  for 14 days remove place in box and send off in mailbox.

## 2023-10-13 NOTE — Progress Notes (Signed)
 Primary Care Physician: Benson [name], NP Primary Cardiologist: Dorn Lesches, MD Electrophysiologist: None  Referring Physician: Dr Lesches [name] Tami Bell is a 83 y.o. female with a history of HLD, HTN, atrial fibrillation who presents for follow up in the [facility].  The patient was initially diagnosed with atrial fibrillation in 2015. Patient spontaneously converted and did not require DCCV. She was previously on Xarelto  for stroke prevention but she wore a 30-day heart monitor in 2017 that showed no atrial fibrillation and her Xarelto  was discontinued.   Patient presents today for follow up for atrial fibrillation. Patient reports that she had bladder surgery at the end of last year and doesn't feel like she has bounced back. She does note palpitations with exertion that resolve with rest. She is in SR today.   Today, she denies symptoms of chest pain, shortness of breath, orthopnea, PND, lower extremity edema, dizziness, presyncope, syncope, snoring, daytime somnolence, bleeding, or neurologic sequela. The patient is tolerating medications without difficulties and is otherwise without complaint today.    Atrial Fibrillation Risk Factors:  she does not have symptoms or diagnosis of sleep apnea. she does not have a history of rheumatic fever.   Atrial Fibrillation Management history:  Previous antiarrhythmic drugs: none Previous cardioversions: none Previous ablations: none Anticoagulation history: Xarelto    ROS- All systems are reviewed and negative except as per the HPI above.  Past Medical History:  Diagnosis Date   Anticoagulated 12/19/2016   Bladder cancer (HCC)    CAP (community acquired pneumonia) 12/19/2016   Cataract    Depression    Dyslipidemia 02/09/2014   hyperlipidemia    Dyspnea on exertion    Glaucoma    Hyperlipidemia    Osteoporosis    Paroxysmal atrial fibrillation (HCC)     Current Outpatient  Medications  Medication Sig Dispense Refill   alendronate (FOSAMAX) 70 MG tablet Take by mouth. (Patient taking differently: Take 70 mg by mouth once a week.)     ALPRAZolam (XANAX) 0.25 MG tablet Take 0.5 tablets by mouth daily as needed for anxiety.      diltiazem  (CARDIZEM ) 30 MG tablet Take 1 tablet by mouth 2 (two) times daily.     donepezil (ARICEPT ODT) 5 MG disintegrating tablet Take 5 mg by mouth at bedtime.     guaiFENesin (MUCINEX) 600 MG 12 hr tablet Take by mouth. (Patient taking differently: Take 600 mg by mouth as needed.)     latanoprost (XALATAN) 0.005 % ophthalmic solution Apply to eye. (Patient taking differently: Place 1 drop into both eyes every morning.)     Latanoprostene Bunod (VYZULTA) 0.024 % SOLN Apply to eye. (Patient taking differently: Place 1 drop into both eyes every morning.)     rosuvastatin  (CRESTOR ) 20 MG tablet TAKE 1 TABLET(20 MG) BY MOUTH DAILY 90 tablet 3   sertraline (ZOLOFT) 50 MG tablet Take 1 tablet by mouth daily.     cyanocobalamin (VITAMIN B12) 1000 MCG/ML injection Inject into the muscle. (Patient not taking: Reported on 10/13/2023)     No current facility-administered medications for this encounter.    Physical Exam: BP (!) 156/64   Pulse (!) 55   Ht 4' 11 (1.499 m)   Wt 43.3 kg   BMI 19.27 kg/m   GEN: Well nourished, well developed in no acute distress NECK: No JVD CARDIAC: Regular rate and rhythm, no murmurs, rubs, gallops RESPIRATORY:  Clear to auscultation without rales, wheezing or rhonchi  ABDOMEN:  Soft, non-tender, non-distended EXTREMITIES:  No edema; No deformity   Wt Readings from Last 3 Encounters:  10/13/23 43.3 kg  07/30/23 42.3 kg  01/30/23 44.9 kg     EKG today demonstrates  SB, blocked PAC Vent. rate 55 BPM PR interval 150 ms QRS duration 108 ms QT/QTcB 426/407 ms   Echo 10/18/20 demonstrated   1. Left ventricular ejection fraction, by estimation, is 55 to 60%. The  left ventricle has normal function. The  left ventricle has no regional  wall motion abnormalities. Left ventricular diastolic parameters were  consistent with impaired realxation; medial tissue Doppler signal acquired during PVCs.   2. Right ventricular systolic function is normal. The right ventricular  size is normal.   3. The mitral valve is abnormally thickened. Mild mitral valve  regurgitation.   4. The aortic valve is tricuspid. There is mild thickening of the aortic  valve. Aortic valve regurgitation is not visualized. Aortic valve  sclerosis is present, with no evidence of aortic valve stenosis.   5. The inferior vena cava is normal in size with greater than 50%  respiratory variability, suggesting right atrial pressure of 3 mmHg.   6. Evidence of atrial level shunting detected by color flow Doppler.   Comparison(s): A prior study was performed on 12/15/2016. Prior images  reviewed side by side. Ectopy seen in both studies; interatrial color  Doppler flow not seen in prior study (seen in this study in the subcostal  view).    CHA2DS2-VASc Score = 5  The patient's score is based upon: CHF History: 0 HTN History: 1 Diabetes History: 0 Stroke History: 0 Vascular Disease History: 1 (aortic atherosclerosis) Age Score: 2 Gender Score: 1       ASSESSMENT AND PLAN: Paroxysmal Atrial Fibrillation (ICD10:  I48.0) The patient's CHA2DS2-VASc score is 5, indicating a 7.2% annual risk of stroke.   Patient in SR today, having palpitations with exertion. Will have her wear a 2 week Zio monitor to evaluate.  We also discussed risks and benefits of anticoagulation. She would like to wait and see the results of the monitor before resuming. Can consider Xarelto  15 mg daily or Eliquis 2.5 mg BID.  Continue diltiazem  30 mg BID  Secondary Hypercoagulable State (ICD10:  D68.69) The patient is at significant risk for stroke/thromboembolism based upon her CHA2DS2-VASc Score of 5.  However, the patient is not on anticoagulation due to  her preference.   HTN Stable on current regimen   Follow up in the AF clinic in one month.        [facility] [address] [place], [place] [address] [phone]

## 2023-10-31 ENCOUNTER — Ambulatory Visit (HOSPITAL_COMMUNITY): Payer: Self-pay | Admitting: Physician Assistant

## 2023-11-13 ENCOUNTER — Ambulatory Visit (HOSPITAL_COMMUNITY)
Admission: RE | Admit: 2023-11-13 | Discharge: 2023-11-13 | Disposition: A | Discharge status: Home / Self Care | Source: Ambulatory Visit | Attending: Physician Assistant | Admitting: Physician Assistant

## 2023-11-13 ENCOUNTER — Other Ambulatory Visit (HOSPITAL_COMMUNITY): Payer: Self-pay | Admitting: *Deleted

## 2023-11-13 VITALS — BP 160/72 | HR 48 | Ht 59.0 in | Wt 94.6 lb

## 2023-11-13 DIAGNOSIS — I480 Paroxysmal atrial fibrillation: Secondary | ICD-10-CM | POA: Diagnosis not present

## 2023-11-13 DIAGNOSIS — D6869 Other thrombophilia: Secondary | ICD-10-CM

## 2023-11-13 DIAGNOSIS — I4891 Unspecified atrial fibrillation: Secondary | ICD-10-CM

## 2023-11-13 MED ORDER — APIXABAN 2.5 MG PO TABS: 2.5000 mg | ORAL_TABLET | Freq: Two times a day (BID) | ORAL | 6 refills | Status: AC

## 2023-11-13 NOTE — Progress Notes (Signed)
 Primary Care Physician: Benson [name], NP Primary Cardiologist: Dorn Lesches, MD Electrophysiologist: None  Referring Physician: Dr Lesches [name] Tami Bell is a 83 y.o. female with a history of HLD, HTN, atrial fibrillation who presents for follow up in the [facility].  The patient was initially diagnosed with atrial fibrillation in 2015. Patient spontaneously converted and did not require DCCV. She was previously on Xarelto  for stroke prevention but she wore a 30-day heart monitor in 2017 that showed no atrial fibrillation and her Xarelto  was discontinued.   Patient returns for follow up for atrial fibrillation. She wore a 2 week cardiac monitor which showed no afib, 6.5% PVC burden. She is in SR today. She reports that her lower abdomen feels weak since bladder surgery, she plans to discuss with her urologist.   Today, she  denies symptoms of chest pain, shortness of breath, orthopnea, PND, lower extremity edema, dizziness, presyncope, syncope, snoring, daytime somnolence, bleeding, or neurologic sequela. The patient is tolerating medications without difficulties and is otherwise without complaint today.    Atrial Fibrillation Risk Factors:  she does not have symptoms or diagnosis of sleep apnea. she does not have a history of rheumatic fever.   Atrial Fibrillation Management history:  Previous antiarrhythmic drugs: none Previous cardioversions: none Previous ablations: none Anticoagulation history: Xarelto    ROS- All systems are reviewed and negative except as per the HPI above.  Past Medical History:  Diagnosis Date   Anticoagulated 12/19/2016   Bladder cancer (HCC)    CAP (community acquired pneumonia) 12/19/2016   Cataract    Depression    Dyslipidemia 02/09/2014   hyperlipidemia    Dyspnea on exertion    Glaucoma    Hyperlipidemia    Osteoporosis    Paroxysmal atrial fibrillation (HCC)     Current Outpatient  Medications  Medication Sig Dispense Refill   alendronate (FOSAMAX) 70 MG tablet Take by mouth.     ALPRAZolam (XANAX) 0.25 MG tablet Take 0.5 tablets by mouth daily as needed for anxiety.      apixaban  (ELIQUIS ) 2.5 MG TABS tablet Take 1 tablet (2.5 mg total) by mouth 2 (two) times daily. 60 tablet 6   Cyanocobalamin (B-12) 1000 MCG CAPS Take 1 capsule by mouth every morning.     diltiazem  (CARDIZEM ) 30 MG tablet Take 1 tablet by mouth 2 (two) times daily.     donepezil (ARICEPT ODT) 5 MG disintegrating tablet Take 5 mg by mouth at bedtime.     guaiFENesin (MUCINEX) 600 MG 12 hr tablet Take by mouth.     latanoprost (XALATAN) 0.005 % ophthalmic solution Apply to eye.     Latanoprostene Bunod (VYZULTA) 0.024 % SOLN Apply to eye.     rosuvastatin  (CRESTOR ) 20 MG tablet TAKE 1 TABLET(20 MG) BY MOUTH DAILY 90 tablet 3   sertraline (ZOLOFT) 50 MG tablet Take 1 tablet by mouth daily.     cyanocobalamin (VITAMIN B12) 1000 MCG/ML injection Inject into the muscle. (Patient not taking: Reported on 11/13/2023)     No current facility-administered medications for this encounter.    Physical Exam: BP (!) 160/72   Pulse (!) 48   Ht 4' 11 (1.499 m)   Wt 42.9 kg   BMI 19.11 kg/m   GEN: Well nourished, well developed in no acute distress CARDIAC: Regular rate and rhythm, no murmurs, rubs, gallops RESPIRATORY:  Clear to auscultation without rales, wheezing or rhonchi  ABDOMEN: Soft, non-tender, non-distended EXTREMITIES:  No  edema; No deformity    Wt Readings from Last 3 Encounters:  11/13/23 42.9 kg  10/13/23 43.3 kg  07/30/23 42.3 kg     EKG today demonstrates  SB, inc RBBB Vent. rate 48 BPM PR interval 158 ms QRS duration 110 ms QT/QTcB 442/394 ms   Echo 10/18/20 demonstrated   1. Left ventricular ejection fraction, by estimation, is 55 to 60%. The  left ventricle has normal function. The left ventricle has no regional  wall motion abnormalities. Left ventricular diastolic  parameters were  consistent with impaired realxation; medial tissue Doppler signal acquired during PVCs.   2. Right ventricular systolic function is normal. The right ventricular  size is normal.   3. The mitral valve is abnormally thickened. Mild mitral valve  regurgitation.   4. The aortic valve is tricuspid. There is mild thickening of the aortic  valve. Aortic valve regurgitation is not visualized. Aortic valve  sclerosis is present, with no evidence of aortic valve stenosis.   5. The inferior vena cava is normal in size with greater than 50%  respiratory variability, suggesting right atrial pressure of 3 mmHg.   6. Evidence of atrial level shunting detected by color flow Doppler.   Comparison(s): A prior study was performed on 12/15/2016. Prior images  reviewed side by side. Ectopy seen in both studies; interatrial color  Doppler flow not seen in prior study (seen in this study in the subcostal  view).    CHA2DS2-VASc Score = 5  The patient's score is based upon: CHF History: 0 HTN History: 1 Diabetes History: 0 Stroke History: 0 Vascular Disease History: 1 (aortic atherosclerosis) Age Score: 2 Gender Score: 1        ASSESSMENT AND PLAN: Paroxysmal Atrial Fibrillation (ICD10:  I48.0) The patient's CHA2DS2-VASc score is 5, indicating a 7.2% annual risk of stroke.   Zio monitor showed 0% afib burden. We revisited risk/benefit discussion regarding anticoagulation. In shared decision making, will start Eliquis  2.5 mg BID (age, weight).  Check cbc in one month.  Continue diltiazem  30 mg BID  Secondary Hypercoagulable State (ICD10:  D68.69) The patient is at significant risk for stroke/thromboembolism based upon her CHA2DS2-VASc Score of 5.  Start Apixaban  (Eliquis ).   HTN Elevated today, patient will continue to monitor and follow up with her PCP.    Follow up with Dr Court in November for annual visit.       [facility] [address] [place], [place] [address] [phone]

## 2023-11-13 NOTE — Patient Instructions (Signed)
 Stop aspirin   Start Eliquis  2.5 mg twice a day  Lab work in 1 month at [facility]

## 2023-11-27 NOTE — Progress Notes (Signed)
 Tami Bell is a 83 y.o.female.  Subjective:   This patient comes in today alone for review of healthcare issues that she has a component of dementia occasionally one of her daughters will accompany her but today she was alone, and she could answer questions fairly well she still drives on occasion she has a very part-time job where she can count of work or not work at [employer] she enjoys that but she has been complaining of some lower abdominal pain that is intermittent in nature since she had bladder surgery done and was last seen by the surgeon in January of this year she was also seen in urgent care in May she had an ultrasound that was done of the abdomen which showed a fair amount of stool present with gas and on exam today she had a lot gas sounds not significantly tender she relates having bowel movements most every day but is a little unclear if she is having effective bowel movements she is eating but still eating small amounts of food though she has kept her weight stable since she was here last.  She still follows along with cardiology for history of atrial fibrillation she is no longer on Eliquis  as they found her burden of this to be extremely low and then she has a cardiology visit in November with her regular cardiologist. She has known osteoporosis with Fosamax supposed to be used.  She has had prescription for diltiazem  that she takes daily by them which can also lead to some constipation and she is supposed to be taking Colace on a daily basis but I do not think she is.  She also is on rosuvastatin  for hyperlipidemia and losartan for blood pressure she sees an eye provider for glaucoma and is using drops for that purpose.  She has long been on sertraline and has a prescription for alprazolam which the family is aware of and wants her to continue with Plan: We discussed the abdominal discomfort in depth today I want her to try some fiber I wrote out directions and want to get will  update her labs as well if we cannot get her with the labs we will contact one of her daughters she needs to keep her follow-up with specialty providers if the abdominal discomfort persist then she is going to need to have a updated scan in the form of a CT scan and possibly a GI referral  _________________________________    Diagnoses this Encounter 1. Bronchiectasis without complication (HCC)      2. Chronic sinusitis, unspecified location      3. Aortic atherosclerosis (HCC)      4. Paroxysmal atrial fibrillation    (CMD)      5. Essential (primary) hypertension      6. Osteoarthritis, unspecified osteoarthritis type, unspecified site      7. Age-related osteoporosis without current pathological fracture      8. Cystocele, midline      9. High risk medication use  CBC with Differential   CBC with Differential    10. Malaise and fatigue  Comprehensive Metabolic Panel   Hemoglobin A1C With Estimated Average Glucose   Lipid Panel   TSH   Comprehensive Metabolic Panel   Hemoglobin A1C With Estimated Average Glucose   Lipid Panel   TSH    11. Mixed hypercholesterolemia and hypertriglyceridemia      12. Moderate recurrent major depression (HCC)      13. Primary insomnia  14. Stress incontinence      15. Lower abdominal pain        Chief Complaint  Patient presents with  . 6 month chronic problem and medication management   Physical Exam Vitals reviewed.  Constitutional:      Appearance: Normal appearance.  HENT:     Head: Normocephalic.     Right Ear: Tympanic membrane normal.     Left Ear: Tympanic membrane normal.     Nose: No congestion or rhinorrhea.     Mouth/Throat:     Mouth: Mucous membranes are moist.  Eyes:     Pupils: Pupils are equal, round, and reactive to light.  Cardiovascular:     Rate and Rhythm: Normal rate and regular rhythm.     Pulses: Normal pulses.     Heart sounds: Normal heart sounds.  Pulmonary:     Effort: Pulmonary  effort is normal.     Breath sounds: Normal breath sounds.  Abdominal:     Palpations: Abdomen is soft.  Musculoskeletal:        General: Normal range of motion.     Cervical back: Normal range of motion.  Skin:    General: Skin is warm and dry.     Capillary Refill: Capillary refill takes less than 2 seconds.  Neurological:     Mental Status: She is alert and oriented to person, place, and time.  Psychiatric:        Mood and Affect: Mood normal.     ______________________ Vitals:   11/27/23 1041  BP: 102/80  BP Location: Left arm  Patient Position: Sitting  Pulse: 50  Resp: 16  Temp: 97.3 F (36.3 C)  TempSrc: Temporal  Weight: 42.6 kg (94 lb)  Height: 1.499 m (4' 11)    Patient's Medications  New Prescriptions   No medications on file  Previous Medications   ALENDRONATE (FOSAMAX) 70 MG TABLET    TAKE 1 TABLET(70 MG) BY MOUTH EVERY 7 DAYS   ALPRAZOLAM (XANAX) 0.25 MG TABLET    Take 1 tablet (0.25 mg total) by mouth nightly as needed for anxiety. Medication refills require office visit every 6 months.   CALCIUM  CITRATE-VITAMIN D2 (CAL-CITRATE) 250 MG-2.5 MCG (100 UNIT) TAB    Take 1 tablet by mouth 2 (two) times a day.   DILTIAZEM  (CARDIZEM ) 30 MG IMMEDIATE RELEASE TABLET    TAKE 1 TABLET BY MOUTH TWICE DAILY   DOCUSATE SODIUM (COLACE) 100 MG CAPSULE    Take 200 mg by mouth daily.   DONEPEZIL (ARICEPT) 5 MG TABLET    Take 1 tablet (5 mg total) by mouth at bedtime.   LATANOPROST (XALATAN) 0.005 % OPHTHALMIC SOLUTION    Administer 1 drop into left eye Once Daily.   LOSARTAN (COZAAR) 25 MG TABLET    Take 1 tablet (25 mg total) by mouth daily.   ROSUVASTATIN  (CRESTOR ) 20 MG TABLET    Take 20 mg by mouth Once Daily.   SERTRALINE (ZOLOFT) 50 MG TABLET    TAKE 1 TABLET BY MOUTH EVERY DAY   VYZULTA 0.024 % DROP    Administer 1 drop into the right eye Once Daily.  Modified Medications   No medications on file  Discontinued Medications   APIXABAN  (ELIQUIS ) 2.5 MG TAB    Take  2.5 mg by mouth 2 (two) times a day.   CYCLOBENZAPRINE (FLEXERIL) 5 MG TABLET    Take 1 tablet (5 mg total) by mouth 3 (three) times a day as needed  for muscle spasms.   DIAZEPAM (VALIUM) 10 MG TABLET    Insert 1 tablet (10 mg total) into the vagina at bedtime.   GUAIFENESIN (MUCINEX) 600 MG 12 HR TABLET    Take 1,200 mg by mouth as needed.   Orders Placed This Encounter  Procedures  . CBC with Differential  . Comprehensive Metabolic Panel  . Hemoglobin A1C With Estimated Average Glucose  . Lipid Panel  . TSH  . CBC with Differential   Problem List[1] Medical History[2] Surgical History[3] Family History[4] Social History   Socioeconomic History  . Marital status: Widowed    Spouse name: Not on file  . Number of children: Not on file  . Years of education: Not on file  . Highest education level: Not on file  Occupational History  . Not on file  Tobacco Use  . Smoking status: Never  . Smokeless tobacco: Never  Vaping Use  . Vaping status: Never Used  Substance and Sexual Activity  . Alcohol use: Never  . Drug use: Never  . Sexual activity: Not Currently    Partners: Male  Other Topics Concern  . Not on file  Social History Narrative  . Not on file   Social Drivers of Health   Food Insecurity: Low Risk  (11/27/2023)   Food vital sign   . Within the past 12 months, you worried that your food would run out before you got money to buy more: Never true   . Within the past 12 months, the food you bought just didn't last and you didn't have money to get more: Never true  Transportation Needs: No Transportation Needs (11/27/2023)   Transportation   . In the past 12 months, has lack of reliable transportation kept you from medical appointments, meetings, work or from getting things needed for daily living? : No  Safety: Low Risk  (11/27/2023)   Safety   . How often does anyone, including family and friends, physically hurt you?: Never   . How often does anyone, including family  and friends, insult or talk down to you?: Never   . How often does anyone, including family and friends, threaten you with harm?: Never   . How often does anyone, including family and friends, scream or curse at you?: Never  Living Situation: Low Risk  (11/27/2023)   Living Situation   . What is your living situation today?: I have a steady place to live   . Think about the place you live. Do you have problems with any of the following? Choose all that apply:: None/None on this list   Allergies[5]  Review of Systems  Constitutional:  Positive for fatigue. Negative for chills and fever.  HENT:  Negative for congestion, nosebleeds, postnasal drip, rhinorrhea, sinus pressure and sore throat.   Respiratory:  Negative for chest tightness and shortness of breath.   Cardiovascular:  Negative for chest pain and palpitations.  Gastrointestinal:  Positive for abdominal pain (Intermittent in nature). Negative for blood in stool, constipation and nausea.  Genitourinary:  Negative for dysuria, flank pain, frequency and urgency.  Musculoskeletal:  Negative for arthralgias.  Skin:  Negative for color change.  Neurological:  Negative for dizziness and light-headedness.  Hematological:  Negative for adenopathy.  Psychiatric/Behavioral:  Negative for agitation.          [1] Patient Active Problem List Diagnosis  . Atrial fibrillation (HCC)  . Chronic sinusitis  . Glaucoma  . Mixed hypercholesterolemia and hypertriglyceridemia  . Osteoarthritis  .  Osteoporosis  . Polyp of sigmoid colon  . Malaise and fatigue  . Bronchiectasis without complication (HCC)  . Liver mass  . Aortic atherosclerosis (HCC)  . Calculus of gallbladder without cholecystitis without obstruction  . Itchy nose  . Primary insomnia  . High risk medication use  . Moderate recurrent major depression (HCC)  . Female cystocele  . Cystocele, midline  . Vaginal vault prolapse after hysterectomy  . Stress incontinence  .  Moderate dementia (CMD)  . Chronic cough  . Essential (primary) hypertension  [2] Past Medical History: Diagnosis Date  . Aortic atherosclerosis   . Arthritis   . Bronchiectasis    (CMD)   . Dementia (CMD)   . Depression with anxiety   . Dyslipidemia   . Glaucoma   . Hyperlipemia   . Hypertension   . Palpitations   . Paroxysmal A-fib    (CMD)   [3] Past Surgical History: Procedure Laterality Date  . ABDOMINAL HYSTERECTOMY     Dr. Nicholaus  . ABLATION COLPOCLESIS N/A 02/13/2023   COLPOCLEISIS performed by Comer Anette Drone, MD at [facility] OR  . BLADDER SUSPENSION N/A 02/13/2023   mid-urethral sling, cysto performed by Comer Anette Drone, [facility]  . COLECTOMY    [4] Family History Problem Relation Name Age of Onset  . Colon cancer Sister    . Diabetes Sister    . Anesthesia problems Neg Hx    . Malig Hyperthermia Neg Hx    [5] No Known Allergies

## 2023-12-16 NOTE — Telephone Encounter (Signed)
 Tami Bell, patient is taking Cardizem  bid. Do you want her to only take one once a day? Patient's daughter was also offered to make a UC follow-up and declined at this time. Please advise.

## 2023-12-16 NOTE — Telephone Encounter (Signed)
 SABRA

## 2023-12-16 NOTE — Telephone Encounter (Signed)
 Daughter is calling back because she is not sure which medication she would like the patient to cut in half.   Spoke with Shanda who confirmed medication is dilTIAZem  (CARDIZEM ) 30 mg immediate release tablet   Will reach out to provider to confirm how medication should be taken.  Call back is: [phone]

## 2023-12-16 NOTE — Telephone Encounter (Signed)
 NP spoke directly to [place] on the phone

## 2023-12-16 NOTE — Telephone Encounter (Signed)
 Copied from CRM #[id number]. Topic: Clinical Concerns - Provider to Provider Call >> Dec 16, 2023 11:13 AM Darice BIRCH wrote: Tami Bell is calling other request    Include all details related to the request(s) below:  Call from [place] NP of [facility] urgent care [place], transferred to [facility] triage   Confirm and type the Best Contact Number below:  Patient/caller contact number:   [phone]           [] Home  [] Mobile  [x] Work  []  Other   []  Okay to leave a voicemail   Medication List:  Current Outpatient Medications:  .  alendronate (FOSAMAX) 70 mg tablet, TAKE 1 TABLET(70 MG) BY MOUTH EVERY 7 DAYS, Disp: 12 tablet, Rfl: 3 .  ALPRAZolam (XANAX) 0.25 mg tablet, Take 1 tablet (0.25 mg total) by mouth nightly as needed for anxiety. Medication refills require office visit every 6 months., Disp: 30 tablet, Rfl: 5 .  calcium  citrate-vitamin D2 (Cal-Citrate) 250 mg-2.5 mcg (100 unit) tab, Take 1 tablet by mouth 2 (two) times a day., Disp: , Rfl:  .  dilTIAZem  (CARDIZEM ) 30 mg immediate release tablet, TAKE 1 TABLET BY MOUTH TWICE DAILY, Disp: 180 tablet, Rfl: 1 .  docusate sodium (COLACE) 100 mg capsule, Take 200 mg by mouth daily., Disp: , Rfl:  .  donepeziL (ARICEPT) 5 mg tablet, Take 1 tablet (5 mg total) by mouth at bedtime., Disp: 90 tablet, Rfl: 3 .  latanoprost (XALATAN) 0.005 % ophthalmic solution, Administer 1 drop into left eye Once Daily., Disp: , Rfl:  .  losartan (COZAAR) 25 mg tablet, Take 1 tablet (25 mg total) by mouth daily., Disp: 90 tablet, Rfl: 3 .  rosuvastatin  (CRESTOR ) 20 mg tablet, Take 20 mg by mouth Once Daily., Disp: , Rfl:  .  sertraline (ZOLOFT) 50 mg tablet, TAKE 1 TABLET BY MOUTH EVERY DAY, Disp: 90 tablet, Rfl: 2 .  Vyzulta 0.024 % drop, Administer 1 drop into the right eye Once Daily., Disp: , Rfl:   Current Facility-Administered Medications:  .  cyanocobalamin (VITAMIN B12) injection 1,000 mcg, 1,000 mcg, intramuscular, Q30 Days, Melissa Joyce Brown  Patram, NP, 1,000 mcg at 04/10/23 1256     Medication Request/Refills: Pharmacy Information (if applicable)   [x]  Not Applicable       []  Pharmacy listed  Send Medication Request to:                                                 []  Pharmacy not listed (added to pharmacy list in Epic) Send Medication Request to:      Listed Pharmacies: [id number] - [place], [place] - [address] AT [facility] OF [address] - PHONE: [phone] - FAX: [phone] [id number] [place], [place] - [address] AT [facility] [address] - PHONE: [phone] - FAX: [phone]

## 2023-12-16 NOTE — Telephone Encounter (Signed)
 LMTC

## 2023-12-18 ENCOUNTER — Other Ambulatory Visit (HOSPITAL_BASED_OUTPATIENT_CLINIC_OR_DEPARTMENT_OTHER): Payer: Self-pay | Admitting: Specialist

## 2023-12-18 DIAGNOSIS — R1084 Generalized abdominal pain: Secondary | ICD-10-CM

## 2023-12-18 DIAGNOSIS — R160 Hepatomegaly, not elsewhere classified: Secondary | ICD-10-CM

## 2023-12-18 DIAGNOSIS — M810 Age-related osteoporosis without current pathological fracture: Secondary | ICD-10-CM

## 2023-12-19 ENCOUNTER — Telehealth (HOSPITAL_COMMUNITY): Payer: Self-pay | Admitting: *Deleted

## 2023-12-19 NOTE — Telephone Encounter (Signed)
 Patient's daughter, Burnard, called in stating they were at urgent care earlier this week and her pulse rates were in the upper 30s. They performed an EKG- will request tracing from urgent care office (Dr Shona at [facility]).  Pt daughter states she intermittently has episodes of extreme fatigue requiring her to lie down in the daytime and this is concerning if she is having slower heart rates they are unaware of.  Encouraged BP/HR monitoring at home and report back.

## 2023-12-19 NOTE — Telephone Encounter (Signed)
 I called [facility] this morning to be sure that they received these orders. Orders have been received. I did ask that they reach out to the daughter [name] for scheduling.   I also called [name]. She did not answer so I left a detailed VM with the [facility] contact information.

## 2024-01-05 ENCOUNTER — Other Ambulatory Visit (HOSPITAL_BASED_OUTPATIENT_CLINIC_OR_DEPARTMENT_OTHER): Admitting: Radiology

## 2024-01-13 ENCOUNTER — Other Ambulatory Visit (HOSPITAL_BASED_OUTPATIENT_CLINIC_OR_DEPARTMENT_OTHER): Admitting: Radiology

## 2024-01-15 ENCOUNTER — Ambulatory Visit (HOSPITAL_BASED_OUTPATIENT_CLINIC_OR_DEPARTMENT_OTHER)
Admission: RE | Admit: 2024-01-15 | Discharge: 2024-01-15 | Disposition: A | Discharge status: Home / Self Care | Source: Ambulatory Visit | Attending: Specialist | Admitting: Specialist

## 2024-01-15 DIAGNOSIS — M810 Age-related osteoporosis without current pathological fracture: Secondary | ICD-10-CM | POA: Insufficient documentation

## 2024-01-15 DIAGNOSIS — R160 Hepatomegaly, not elsewhere classified: Secondary | ICD-10-CM | POA: Diagnosis present

## 2024-01-15 DIAGNOSIS — R1084 Generalized abdominal pain: Secondary | ICD-10-CM | POA: Diagnosis present

## 2024-01-15 MED ORDER — IOHEXOL 300 MG/ML  SOLN
80.0000 mL | Freq: Once | INTRAMUSCULAR | Status: AC | PRN
Start: 2024-01-15 — End: 2024-01-15
  Administered 2024-01-15: 80 mL via INTRAVENOUS

## 2024-01-22 ENCOUNTER — Other Ambulatory Visit (HOSPITAL_BASED_OUTPATIENT_CLINIC_OR_DEPARTMENT_OTHER): Admitting: Radiology

## 2024-02-02 ENCOUNTER — Encounter: Payer: Self-pay | Admitting: Cardiovascular Disease

## 2024-02-02 ENCOUNTER — Ambulatory Visit: Attending: Cardiovascular Disease | Admitting: Cardiovascular Disease

## 2024-02-02 VITALS — BP 150/70 | HR 60 | Ht 59.0 in | Wt 93.0 lb

## 2024-02-02 DIAGNOSIS — I480 Paroxysmal atrial fibrillation: Secondary | ICD-10-CM

## 2024-02-02 DIAGNOSIS — E785 Hyperlipidemia, unspecified: Secondary | ICD-10-CM | POA: Diagnosis not present

## 2024-02-02 NOTE — Assessment & Plan Note (Signed)
 History of remote PAF without recurrence.  She was on Xarelto  briefly but over 30 event monitor did not show recurrent A-fib and I stopped her anticoagulation.  She did have a event monitor placed 10/13/2023 that showed a 6.5% PVC burden but no sustained arrhythmias and no evidence of A-fib.  She does not feel palpitations however.

## 2024-02-02 NOTE — Progress Notes (Signed)
 02/02/2024 Tami Bell   19-Nov-1940  [id number]  Primary Physician Benson [name], NP Primary Cardiologist: Dorn [name] MD [name]  HPI:  Tami Bell is a 83 y.o.  mildly overweight married Caucasian female who states husband Tami Bell was also a patient of mine but unfortunately passed away 18-Nov-2019 of cancer.  They were married 25 years.  She is accompanied by one of her daughters Tami Bell today.. I last saw her in the office 01/29/2023.  She is the mother of 60, grandmother and 5 grandchildren. She was referred by Dr. Thurmond in [place]  for evaluation treatment of new onset symptomatic atrial fibrillation. Her only cardiovascular risk factor is treated hyperlipidemia. She does have an older brother who died of a myocardial infarction and had bypass surgery. She has never had a heart attack or stroke. She denies chest pain but has had fairly recent increase in dyspnea on exertion. There is no history of bleeding problems. She noticed increased heart rate and fatigue 2-3 weeks prior to seeing me late last yearand saw her primary care physician who documented new onset A. Fib with RVR. He began low-dose beta blocker and oral anticoagulation resulting in improvement in her symptoms. Routine lab work was performed and apparently her thyroid function tests were normal.she was hospitalized in December with A. Fib with RVR and spontaneously converted to sinus rhythm. She was complaining of weakness and increasing dyspnea on exertion. Recent Myoview stress test showed no ischemia but had decreased ejection fraction and 2-D echo revealed an EF of 45-50% with mild global hypokinesia. I performed outpatient cardiac catheterization on her 05/05/14 via the right radial approach revealing normal coronary arteries and low normal LV function. Since that time her dyspnea has markedly improved. She wore a 1 month event monitor that showed no evidence of PAF and as a result I  decided to stop her Xarelto  . Since I saw her 6 months ago she was hospitalized for daily quite pneumonia was noted to have PACs and MAT but no PAF was demonstrated.   Since I saw her a year ago she continues to do well.  She said no episodes of PAF.  She denies chest pain or shortness of breath.  She does currently live alone, and is independent.  She drives, cooks for herself and does her own chores.  She volunteers at the [employer] 3 days a week and walks on a daily basis.  She apparently had bladder surgery which she has recovered from but this is her biggest complaint.   Current Meds  Medication Sig   ALPRAZolam (XANAX) 0.25 MG tablet Take 0.5 tablets by mouth daily as needed for anxiety.    apixaban  (ELIQUIS ) 2.5 MG TABS tablet Take 1 tablet (2.5 mg total) by mouth 2 (two) times daily.   Cyanocobalamin (B-12) 1000 MCG CAPS Take 1 capsule by mouth every morning.   cyanocobalamin (VITAMIN B12) 1000 MCG/ML injection Inject into the muscle.   diltiazem  (CARDIZEM ) 30 MG tablet Take 1 tablet by mouth 2 (two) times daily.   donepezil (ARICEPT ODT) 5 MG disintegrating tablet Take 5 mg by mouth at bedtime.   guaiFENesin (MUCINEX) 600 MG 12 hr tablet Take by mouth.   latanoprost (XALATAN) 0.005 % ophthalmic solution Apply to eye.   Latanoprostene Bunod (VYZULTA) 0.024 % SOLN Apply to eye.   rosuvastatin  (CRESTOR ) 20 MG tablet TAKE 1 TABLET(20 MG) BY MOUTH DAILY   sertraline (ZOLOFT) 50 MG tablet Take  1 tablet by mouth daily.     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  Tobacco Use   Smoking status: Never   Smokeless tobacco: Never  Vaping Use   Vaping status: Never Used  Substance and Sexual Activity   Alcohol use:  No    Alcohol/week: 0.0 standard drinks of alcohol   Drug use: No   Sexual activity: Not on file  Other Topics Concern   Not on file  Social History Narrative   Not on file   Social Drivers of Health   Financial Resource Strain: Not on file  Food Insecurity: Low Risk  (11/27/2023)   Received from [facility]   Hunger Vital Sign    Within the past 12 months, you worried that your food would run out before you got money to buy more: Never true    Within the past 12 months, the food you bought just didn't last and you didn't have money to get more. : Never true  Transportation Needs: No Transportation Needs (11/27/2023)   Received from [facility]    In the past 12 months, has lack of reliable transportation kept you from medical appointments, meetings, work or from getting things needed for daily living? : No  Physical Activity: Not on file  Stress: Not on file  Social Connections: Not on file  Intimate Partner Violence: Not on file     Review of Systems: General: negative for chills, fever, night sweats or weight changes.  Cardiovascular: negative for chest pain, dyspnea on exertion, edema, orthopnea, palpitations, paroxysmal nocturnal dyspnea or shortness of breath Dermatological: negative for rash Respiratory: negative for cough or wheezing Urologic: negative for hematuria Abdominal: negative for nausea, vomiting, diarrhea, bright red blood per rectum, melena, or hematemesis Neurologic: negative for visual changes, syncope, or dizziness All other systems reviewed and are otherwise negative except as noted above.    Blood pressure (!) 150/70, pulse 60, height 4' 11 (1.499 m), weight 93 lb (42.2 kg).  General appearance: alert and no distress Neck: no adenopathy, no carotid bruit, no JVD, supple, symmetrical, trachea midline, and thyroid not enlarged, symmetric, no tenderness/mass/nodules Lungs: clear to auscultation bilaterally Heart: regular rate and  rhythm, S1, S2 normal, no murmur, click, rub or gallop Extremities: extremities normal, atraumatic, no cyanosis or edema Pulses: 2+ and symmetric Skin: Skin color, texture, turgor normal. No rashes or lesions Neurologic: Grossly normal  EKG not performed today      ASSESSMENT AND PLAN:   Paroxysmal atrial fibrillation (HCC) History of remote PAF without recurrence.  She was on Xarelto  briefly but over 30 event monitor did not show recurrent A-fib and I stopped her anticoagulation.  She did have a event monitor placed 10/13/2023 that showed a 6.5% PVC burden but no sustained arrhythmias and no evidence of A-fib.  She does not feel palpitations however.  Dyslipidemia History of dyslipidemia on statin therapy with lipid profile performed 04/23/2022 revealing total cholesterol 172, LDL of 100 and HDL of 50.  She is at goal for primary prevention given her clean coronary arteries by cath in the past.     Dorn DOROTHA Lesches MD [facility], [facility] 02/02/2024 9:19 AM

## 2024-02-02 NOTE — Assessment & Plan Note (Signed)
 History of dyslipidemia on statin therapy with lipid profile performed 04/23/2022 revealing total cholesterol 172, LDL of 100 and HDL of 50.  She is at goal for primary prevention given her clean coronary arteries by cath in the past.

## 2024-02-02 NOTE — Patient Instructions (Signed)
 Medication Instructions:  Your physician recommends that you continue on your current medications as directed. Please refer to the Current Medication list given to you today.  *If you need a refill on your cardiac medications before your next appointment, please call your pharmacy*    Follow-Up: At [facility], you and your health needs are our priority.  As part of our continuing mission to provide you with exceptional heart care, our providers are all part of one team.  This team includes your primary Cardiologist (physician) and Advanced Practice Providers or APPs (Physician Assistants and Nurse Practitioners) who all work together to provide you with the care you need, when you need it.  Your next appointment:   1 year(s)  Provider:   Dorn Lesches, MD

## 2024-03-22 ENCOUNTER — Other Ambulatory Visit: Payer: Self-pay | Admitting: Cardiovascular Disease
